# Patient Record
Sex: Male | Born: 1970 | Race: White | Hispanic: No | Marital: Married | Smoking: Never smoker
Health system: Southern US, Community
[De-identification: ages and names within clinical notes are randomized; demographics above are authoritative.]

## PROBLEM LIST (undated history)

## (undated) DIAGNOSIS — M1990 Unspecified osteoarthritis, unspecified site: Secondary | ICD-10-CM

## (undated) DIAGNOSIS — K219 Gastro-esophageal reflux disease without esophagitis: Secondary | ICD-10-CM

## (undated) DIAGNOSIS — E785 Hyperlipidemia, unspecified: Secondary | ICD-10-CM

## (undated) DIAGNOSIS — I10 Essential (primary) hypertension: Secondary | ICD-10-CM

## (undated) HISTORY — DX: Hyperlipidemia, unspecified: E78.5

## (undated) HISTORY — PX: KNEE ARTHROSCOPY: SUR90

## (undated) HISTORY — DX: Unspecified osteoarthritis, unspecified site: M19.90

## (undated) HISTORY — PX: ARTHROSCOPIC REPAIR ACL: SUR80

## (undated) HISTORY — DX: Essential (primary) hypertension: I10

## (undated) HISTORY — DX: Gastro-esophageal reflux disease without esophagitis: K21.9

---

## 1999-01-13 ENCOUNTER — Encounter: Payer: Self-pay | Admitting: Emergency Medicine

## 1999-01-13 ENCOUNTER — Emergency Department (HOSPITAL_COMMUNITY): Admission: EM | Admit: 1999-01-13 | Discharge: 1999-01-13 | Payer: Self-pay | Admitting: Emergency Medicine

## 2007-03-25 ENCOUNTER — Emergency Department (HOSPITAL_COMMUNITY): Admission: EM | Admit: 2007-03-25 | Discharge: 2007-03-25 | Payer: Self-pay | Admitting: Emergency Medicine

## 2011-04-27 LAB — CBC
HCT: 46.1
Hemoglobin: 15.8
MCHC: 34.2
MCV: 89.3
Platelets: 275
RBC: 5.17
RDW: 12.8
WBC: 7

## 2011-04-27 LAB — POCT I-STAT CREATININE
Creatinine, Ser: 1.3
Operator id: 235561

## 2011-04-27 LAB — DIFFERENTIAL
Basophils Absolute: 0
Basophils Relative: 0
Eosinophils Absolute: 0.2
Eosinophils Relative: 3
Lymphocytes Relative: 27
Lymphs Abs: 1.9
Monocytes Absolute: 0.6
Monocytes Relative: 9
Neutro Abs: 4.3
Neutrophils Relative %: 61

## 2011-04-27 LAB — I-STAT 8, (EC8 V) (CONVERTED LAB)
BUN: 23
Bicarbonate: 26 — ABNORMAL HIGH
Chloride: 100
Glucose, Bld: 90
HCT: 51
Hemoglobin: 17.3 — ABNORMAL HIGH
Operator id: 235561
Potassium: 3.5
Sodium: 136
TCO2: 27
pCO2, Ven: 44.5 — ABNORMAL LOW
pH, Ven: 7.375 — ABNORMAL HIGH

## 2011-11-09 ENCOUNTER — Other Ambulatory Visit (HOSPITAL_COMMUNITY): Payer: Self-pay | Admitting: Physician Assistant

## 2011-11-09 ENCOUNTER — Ambulatory Visit (HOSPITAL_COMMUNITY)
Admission: RE | Admit: 2011-11-09 | Discharge: 2011-11-09 | Disposition: A | Discharge status: Home / Self Care | Payer: PRIVATE HEALTH INSURANCE | Source: Ambulatory Visit | Attending: Physician Assistant | Admitting: Physician Assistant

## 2011-11-09 DIAGNOSIS — R52 Pain, unspecified: Secondary | ICD-10-CM

## 2011-11-09 DIAGNOSIS — R079 Chest pain, unspecified: Secondary | ICD-10-CM | POA: Insufficient documentation

## 2013-03-19 ENCOUNTER — Other Ambulatory Visit: Payer: Self-pay | Admitting: Family Medicine

## 2013-03-23 LAB — PATHOLOGY

## 2013-12-10 IMAGING — CR DG CHEST 2V
2 series · 2 of 2 positions shown · non-contrast
Comparison: None.

CLINICAL DATA: Right chest pain, worse with deep inspiration.

CHEST - 2 VIEW

[w chest pa]
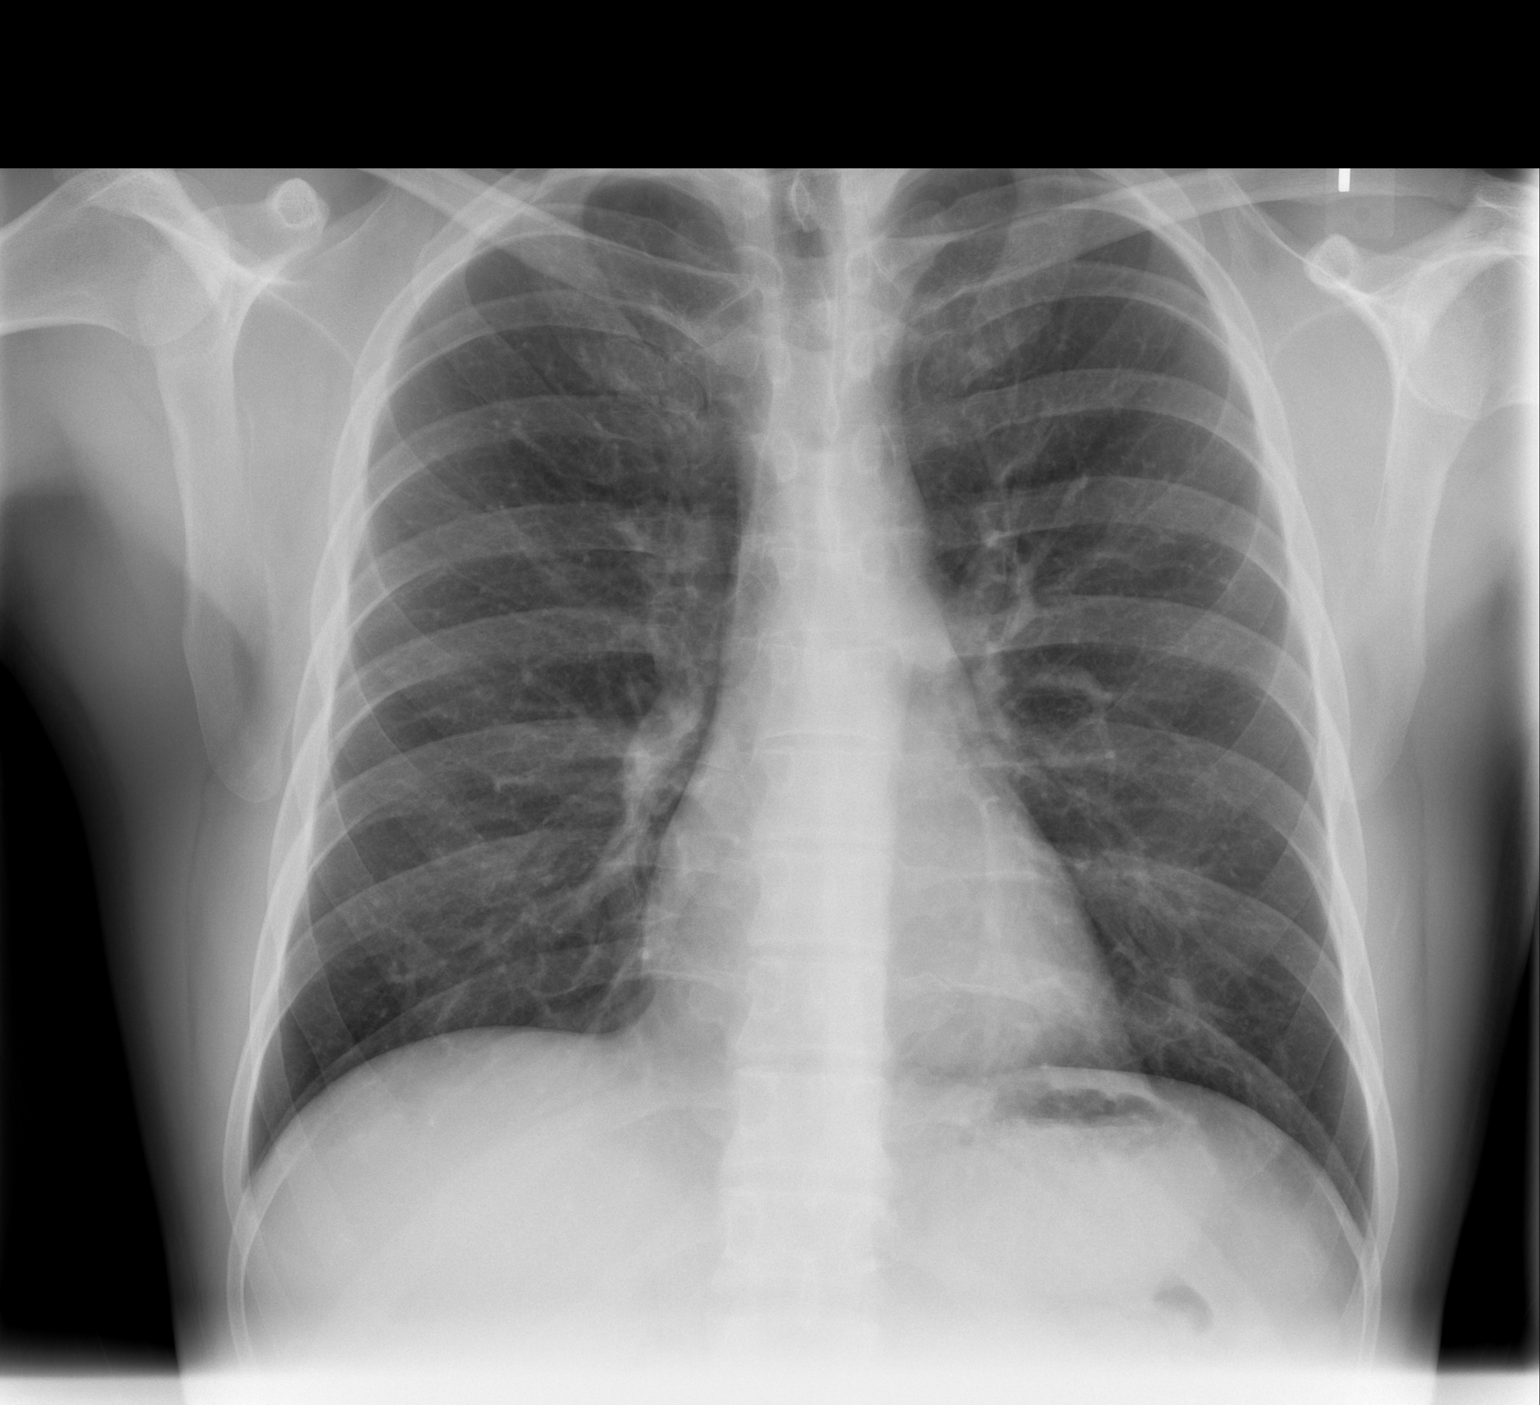

[w chest lat]
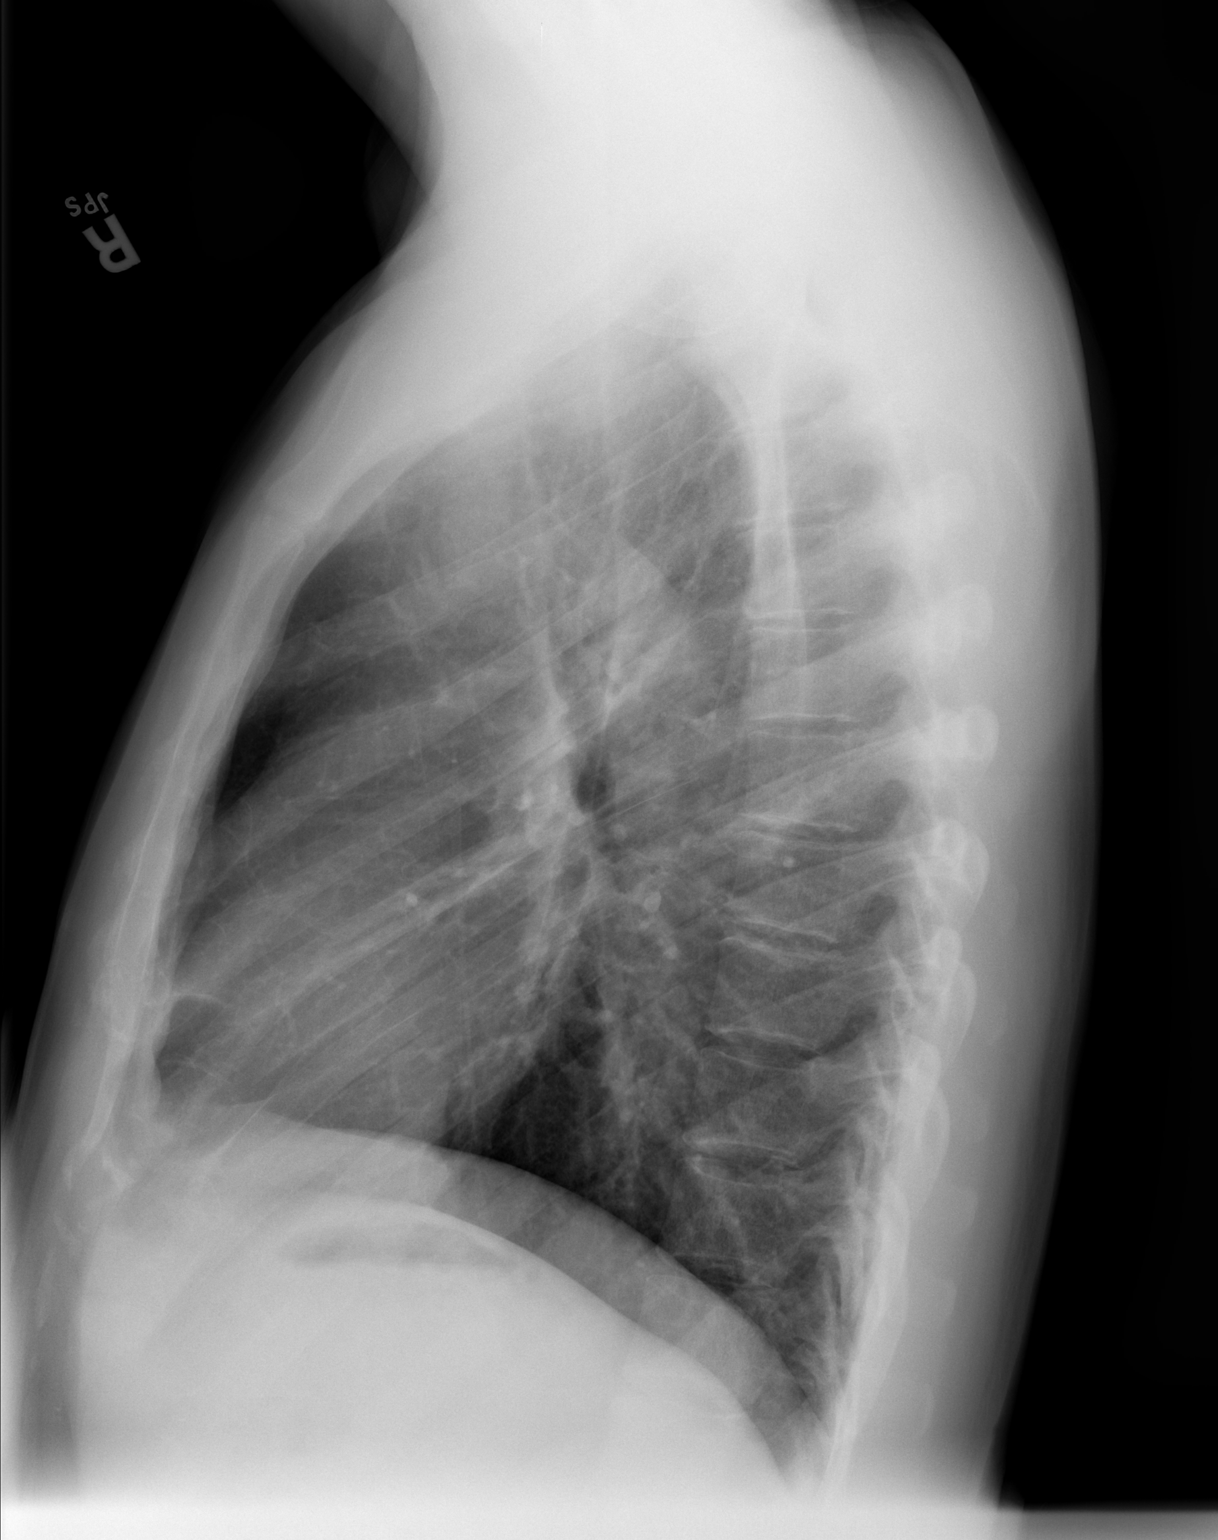

[2 of 2 positions shown; findings below may reference images not displayed]

FINDINGS: Heart and mediastinal contours are within normal limits.
The lung fields are clear with no signs of focal infiltrate or
congestive failure.  No pleural fluid or significant peribronchial
cuffing is noted.

Bony structures appear intact.
IMPRESSION: No worrisome focal or acute cardiopulmonary abnormality noted.

## 2017-07-16 HISTORY — PX: TOTAL KNEE ARTHROPLASTY: SHX125

## 2022-12-11 ENCOUNTER — Encounter: Payer: Self-pay | Admitting: Gastroenterology

## 2022-12-20 ENCOUNTER — Ambulatory Visit (AMBULATORY_SURGERY_CENTER): Payer: No Typology Code available for payment source

## 2022-12-20 VITALS — Ht 70.0 in | Wt 180.0 lb

## 2022-12-20 DIAGNOSIS — Z1211 Encounter for screening for malignant neoplasm of colon: Secondary | ICD-10-CM

## 2022-12-20 MED ORDER — NA SULFATE-K SULFATE-MG SULF 17.5-3.13-1.6 GM/177ML PO SOLN: 1.0000 | Freq: Once | ORAL | 0 refills | Status: AC

## 2022-12-20 NOTE — Progress Notes (Signed)
Pre visit completed via phone call; Patient verified name, DOB, and address;  No egg or soy allergy known to patient;  No issues known to pt with past sedation with any surgeries or procedures; Patient denies ever being told they had issues or difficulty with intubation;  No FH of Malignant Hyperthermia; Pt is not on diet pills; Pt is not on home 02;  Pt is not on blood thinners;  Pt denies issues with constipation;  No A fib or A flutter; Have any cardiac testing pending--NO Pt instructed to use Single[employer]com or GoodRx for a price reduction on prep;   Insurance verified during PV appt=[employer]  Patient's chart reviewed by Cathlyn Parsons CNRA prior to previsit and patient appropriate for the [facility].  Previsit completed and red dot placed by patient's name on their procedure day (on provider's schedule).    Instructions printed and mailed to the patient; GoodRX coupon info sent to in Rx;

## 2023-01-01 ENCOUNTER — Encounter: Payer: Self-pay | Admitting: Gastroenterology

## 2023-01-23 ENCOUNTER — Ambulatory Visit (AMBULATORY_SURGERY_CENTER): Payer: Self-pay | Admitting: Gastroenterology

## 2023-01-23 ENCOUNTER — Encounter: Payer: Self-pay | Admitting: Gastroenterology

## 2023-01-23 VITALS — BP 107/71 | HR 56 | Temp 97.3°F | Resp 17 | Ht 70.0 in | Wt 180.0 lb

## 2023-01-23 DIAGNOSIS — D127 Benign neoplasm of rectosigmoid junction: Secondary | ICD-10-CM

## 2023-01-23 DIAGNOSIS — Z1211 Encounter for screening for malignant neoplasm of colon: Secondary | ICD-10-CM

## 2023-01-23 DIAGNOSIS — D123 Benign neoplasm of transverse colon: Secondary | ICD-10-CM

## 2023-01-23 DIAGNOSIS — D128 Benign neoplasm of rectum: Secondary | ICD-10-CM

## 2023-01-23 NOTE — Progress Notes (Signed)
Pt's states no medical or surgical changes since previsit or office visit. 

## 2023-01-23 NOTE — Patient Instructions (Signed)
YOU HAD AN ENDOSCOPIC PROCEDURE TODAY AT THE [facility]:   Refer to the procedure report that was given to you for any specific questions about what was found during the examination.  If the procedure report does not answer your questions, please call your gastroenterologist to clarify.  If you requested that your care partner not be given the details of your procedure findings, then the procedure report has been included in a sealed envelope for you to review at your convenience later.  YOU SHOULD EXPECT: Some feelings of bloating in the abdomen. Passage of more gas than usual.  Walking can help get rid of the air that was put into your GI tract during the procedure and reduce the bloating. If you had a lower endoscopy (such as a colonoscopy or flexible sigmoidoscopy) you may notice spotting of blood in your stool or on the toilet paper. If you underwent a bowel prep for your procedure, you may not have a normal bowel movement for a few days.  Please Note:  You might notice some irritation and congestion in your nose or some drainage.  This is from the oxygen used during your procedure.  There is no need for concern and it should clear up in a day or so.  SYMPTOMS TO REPORT IMMEDIATELY:  Following lower endoscopy (colonoscopy or flexible sigmoidoscopy):  Excessive amounts of blood in the stool  Significant tenderness or worsening of abdominal pains  Swelling of the abdomen that is new, acute  Fever of 100F or higher  For urgent or emergent issues, a gastroenterologist can be reached at any hour by calling [phone]. Do not use MyChart messaging for urgent concerns.    DIET:  We do recommend a small meal at first, but then you may proceed to your regular diet.  Drink plenty of fluids but you should avoid alcoholic beverages for 24 hours.  ACTIVITY:  You should plan to take it easy for the rest of today and you should NOT DRIVE or use heavy machinery until tomorrow (because of  the sedation medicines used during the test).    FOLLOW UP: Our staff will call the number listed on your records the next business day following your procedure.  We will call around 7:15- 8:00 am to check on you and address any questions or concerns that you may have regarding the information given to you following your procedure. If we do not reach you, we will leave a message.     If any biopsies were taken you will be contacted by phone or by letter within the next 1-3 weeks.  Please call us at [phone] if you have not heard about the biopsies in 3 weeks.    SIGNATURES/CONFIDENTIALITY: You and/or your care partner have signed paperwork which will be entered into your electronic medical record.  These signatures attest to the fact that that the information above on your After Visit Summary has been reviewed and is understood.  Full responsibility of the confidentiality of this discharge information lies with you and/or your care-partner.  

## 2023-01-23 NOTE — Progress Notes (Signed)
Called to room to assist during endoscopic procedure.  Patient ID and intended procedure confirmed with present staff. Received instructions for my participation in the procedure from the performing physician.  

## 2023-01-23 NOTE — Progress Notes (Signed)
A/ox3, pleased with MAC, report to RN 

## 2023-01-23 NOTE — Op Note (Signed)
[facility] Patient Name: Parker Gould Procedure Date: 01/23/2023 9:16 AM MRN: [id number] Endoscopist: Corliss Parish , MD, [id number] Age: 52 Referring MD:  Date of Birth: [birth year] Gender: Male Account #: [id number] Procedure:                Colonoscopy Indications:              Screening for colorectal malignant neoplasm Medicines:                Monitored Anesthesia Care Procedure:                Pre-Anesthesia Assessment:                           - Prior to the procedure, a History and Physical                            was performed, and patient medications and                            allergies were reviewed. The patient's tolerance of                            previous anesthesia was also reviewed. The risks                            and benefits of the procedure and the sedation                            options and risks were discussed with the patient.                            All questions were answered, and informed consent                            was obtained. Prior Anticoagulants: The patient has                            taken no anticoagulant or antiplatelet agents. ASA                            Grade Assessment: II - A patient with mild systemic                            disease. After reviewing the risks and benefits,                            the patient was deemed in satisfactory condition to                            undergo the procedure.                           After obtaining informed consent, the colonoscope  was passed under direct vision. Throughout the                            procedure, the patient's blood pressure, pulse, and                            oxygen saturations were monitored continuously. The                            CF [id number] #[id number] was introduced through the anus                            and advanced to the 3 cm into the ileum. The                            colonoscopy was  performed without difficulty. The                            patient tolerated the procedure. The quality of the                            bowel preparation was good. The terminal ileum,                            ileocecal valve, appendiceal orifice, and rectum                            were photographed. Scope In: 9:20:10 AM Scope Out: 9:38:15 AM Scope Withdrawal Time: 0 hours 15 minutes 30 seconds  Total Procedure Duration: 0 hours 18 minutes 5 seconds  Findings:                 The digital rectal exam findings include                            hemorrhoids. Pertinent negatives include no                            palpable rectal lesions.                           The terminal ileum and ileocecal valve appeared                            normal.                           Five sessile polyps were found in the rectum,                            recto-sigmoid colon and transverse colon. The                            polyps were 2 to 7 mm in size. These polyps were  removed with a cold snare. Resection and retrieval                            were complete.                           Normal mucosa was found in the entire colon                            otherwise.                           Non-bleeding non-thrombosed internal hemorrhoids                            were found during retroflexion, during perianal                            exam and during digital exam. The hemorrhoids were                            Grade II (internal hemorrhoids that prolapse but                            reduce spontaneously). Complications:            No immediate complications. Estimated Blood Loss:     Estimated blood loss was minimal. Impression:               - Hemorrhoids found on digital rectal exam.                           - The examined portion of the ileum was normal.                           - Five 2 to 7 mm polyps in the rectum, at the                             recto-sigmoid colon and in the transverse colon,                            removed with a cold snare. Resected and retrieved.                           - Normal mucosa in the entire examined colon                            otherwise.                           - Non-bleeding non-thrombosed internal hemorrhoids. Recommendation:           - The patient will be observed post-procedure,                            until all discharge criteria are met.                           -  Discharge patient to home.                           - Patient has a contact number available for                            emergencies. The signs and symptoms of potential                            delayed complications were discussed with the                            patient. Return to normal activities tomorrow.                            Written discharge instructions were provided to the                            patient.                           - High fiber diet.                           - Use FiberCon 1-2 tablets PO daily.                           - Continue present medications.                           - Await pathology results.                           - Repeat colonoscopy in 3-7 years for surveillance                            based on pathology results.                           - The findings and recommendations were discussed                            with the patient.                           - The findings and recommendations were discussed                            with the patient's family. Corliss Parish, MD 01/23/2023 9:44:18 AM

## 2023-01-23 NOTE — Progress Notes (Signed)
GASTROENTEROLOGY PROCEDURE H&P NOTE   Primary Care Physician: Pcp, No  HPI: Parker Gould is a 52 y.o. male who presents for Colonoscopy for screening.  Past Medical History:  Diagnosis Date   Arthritis    LEFT knee   GERD (gastroesophageal reflux disease)    PRN OTC meds   Hyperlipidemia    on meds   Hypertension    on meds   Past Surgical History:  Procedure Laterality Date   ARTHROSCOPIC REPAIR ACL Left    x 2   KNEE ARTHROSCOPY Left    x 2   TOTAL KNEE ARTHROPLASTY Left 2019   Current Outpatient Medications  Medication Sig Dispense Refill   atorvastatin (LIPITOR) 20 MG tablet Take 20 mg by mouth daily.     benazepril (LOTENSIN) 10 MG tablet Take 10 mg by mouth daily.     esomeprazole (NEXIUM) 20 MG capsule Take 20 mg by mouth daily as needed (acid reflux).     No current facility-administered medications for this visit.    Current Outpatient Medications:    atorvastatin (LIPITOR) 20 MG tablet, Take 20 mg by mouth daily., Disp: , Rfl:    benazepril (LOTENSIN) 10 MG tablet, Take 10 mg by mouth daily., Disp: , Rfl:    esomeprazole (NEXIUM) 20 MG capsule, Take 20 mg by mouth daily as needed (acid reflux)., Disp: , Rfl:  No Known Allergies Family History  Problem Relation Age of Onset   Colon cancer Neg Hx    Colon polyps Neg Hx    Esophageal cancer Neg Hx    Stomach cancer Neg Hx    Rectal cancer Neg Hx    Social History   Socioeconomic History   Marital status: Married    Spouse name: Not on file   Number of children: Not on file   Years of education: Not on file   Highest education level: Not on file  Occupational History   Not on file  Tobacco Use   Smoking status: Never   Smokeless tobacco: Never  Vaping Use   Vaping Use: Never used  Substance and Sexual Activity   Alcohol use: Not Currently   Drug use: Never   Sexual activity: Not on file  Other Topics Concern   Not on file  Social History Narrative   Not on file   Social Determinants  of Health   Financial Resource Strain: Not on file  Food Insecurity: Not on file  Transportation Needs: Not on file  Physical Activity: Not on file  Stress: Not on file  Social Connections: Not on file  Intimate Partner Violence: Not on file    Physical Exam: Today's Vitals   01/23/23 0808 01/23/23 0809  BP: 139/83   Pulse: 69   Temp: (!) 97.3 F (36.3 C) (!) 97.3 F (36.3 C)  TempSrc: Temporal   SpO2: 99%   Weight: 180 lb (81.6 kg)   Height: 5\' 10"  (1.778 m)    Body mass index is 25.83 kg/m. GEN: NAD EYE: Sclerae anicteric ENT: MMM CV: Non-tachycardic GI: Soft, NT/ND NEURO:  Alert & Oriented x 3  Lab Results: No results for input(s): "WBC", "HGB", "HCT", "PLT" in the last 72 hours. BMET No results for input(s): "NA", "K", "CL", "CO2", "GLUCOSE", "BUN", "CREATININE", "CALCIUM" in the last 72 hours. LFT No results for input(s): "PROT", "ALBUMIN", "AST", "ALT", "ALKPHOS", "BILITOT", "BILIDIR", "IBILI" in the last 72 hours. PT/INR No results for input(s): "LABPROT", "INR" in the last 72 hours.   Impression /  Plan: This is a 52 y.o.male who presents for Colonoscopy for screening.  The risks and benefits of endoscopic evaluation/treatment were discussed with the patient and/or family; these include but are not limited to the risk of perforation, infection, bleeding, missed lesions, lack of diagnosis, severe illness requiring hospitalization, as well as anesthesia and sedation related illnesses.  The patient's history has been reviewed, patient examined, no change in status, and deemed stable for procedure.  The patient and/or family is agreeable to proceed.    Corliss Parish, MD [facility]oscopy Office # [phone]

## 2023-01-24 ENCOUNTER — Telehealth: Payer: Self-pay

## 2023-01-24 NOTE — Telephone Encounter (Signed)
  Follow up Call-     01/23/2023    8:09 AM  Call back number  Post procedure Call Back phone  # [phone]  Permission to leave phone message Yes     Patient questions:  Do you have a fever, pain , or abdominal swelling? No. Pain Score  0 *  Have you tolerated food without any problems? Yes.    Have you been able to return to your normal activities? Yes.    Do you have any questions about your discharge instructions: Diet   No. Medications  No. Follow up visit  No.  Do you have questions or concerns about your Care? No.  Actions: * If pain score is 4 or above: No action needed, pain <4.

## 2023-01-30 ENCOUNTER — Encounter: Payer: Self-pay | Admitting: Gastroenterology

## 2024-04-06 NOTE — Progress Notes (Incomplete)
  Cardiology Office Note:   Date:  04/06/2024  ID:  Parker Gould, DOB November 10, 1970, MRN [id number] PCP: Freddrick No  [facility] Providers Cardiologist:  None { Chief Complaint: No chief complaint on file.     History of Present Illness:   Parker Gould is a 53 y.o. male with a PMH of HTN and HLD who presents as a self-referral for the evaluation of bradycardia.   Past Medical History:  Diagnosis Date   Arthritis    LEFT knee   GERD (gastroesophageal reflux disease)    PRN OTC meds   Hyperlipidemia    on meds   Hypertension    on meds     Studies Reviewed:    EKG: ***           Risk Assessment/Calculations:   {Does this patient have ATRIAL FIBRILLATION?:[phone]} No BP recorded.  {Refresh Note OR Click here to enter BP  :1}***        Physical Exam:     VS:  There were no vitals taken for this visit. ***    Wt Readings from Last 3 Encounters:  01/23/23 180 lb (81.6 kg)  12/20/22 180 lb (81.6 kg)     GEN: Well nourished, well developed, in no acute distress NECK: No JVD; No carotid bruits CARDIAC: ***RRR, no murmurs, rubs, gallops RESPIRATORY:  Clear to auscultation without rales, wheezing or rhonchi  ABDOMEN: Soft, non-tender, non-distended, normal bowel sounds EXTREMITIES:  Warm and well perfused, no edema; No deformity, 2+ radial pulses PSYCH: Normal mood and affect   Assessment & Plan       {Are you ordering a CV Procedure (e.g. stress test, cath, DCCV, TEE, etc)?   Press F2        :[id number]}   This note was written with the assistance of a dictation microphone or AI dictation software. Please excuse any typos or grammatical errors.   Signed, Georganna Archer, MD 04/06/2024 1:57 PM    [facility]

## 2024-04-07 ENCOUNTER — Encounter: Payer: Self-pay | Admitting: Student in an Organized Health Care Education/Training Program

## 2024-04-07 ENCOUNTER — Ambulatory Visit
Attending: Student in an Organized Health Care Education/Training Program | Admitting: Student in an Organized Health Care Education/Training Program

## 2024-04-07 VITALS — BP 122/84 | HR 74 | Ht 70.0 in | Wt 178.0 lb

## 2024-04-07 DIAGNOSIS — R001 Bradycardia, unspecified: Secondary | ICD-10-CM | POA: Diagnosis not present

## 2024-04-07 DIAGNOSIS — R55 Syncope and collapse: Secondary | ICD-10-CM

## 2024-04-07 DIAGNOSIS — I10 Essential (primary) hypertension: Secondary | ICD-10-CM | POA: Diagnosis not present

## 2024-04-07 NOTE — Patient Instructions (Signed)
 Testing/Procedures: Echocardiogram  Your physician has requested that you have an echocardiogram. Echocardiography is a painless test that uses sound waves to create images of your heart. It provides your doctor with information about the size and shape of your heart and how well your heart's chambers and valves are working. This procedure takes approximately one hour. There are no restrictions for this procedure. Please do NOT wear cologne, perfume, aftershave, or lotions (deodorant is allowed). Please arrive 15 minutes prior to your appointment time.  Please note: We ask at that you not bring children with you during ultrasound (echo/ vascular) testing. Due to room size and safety concerns, children are not allowed in the ultrasound rooms during exams. Our front office staff cannot provide observation of children in our lobby area while testing is being conducted. An adult accompanying a patient to their appointment will only be allowed in the ultrasound room at the discretion of the ultrasound technician under special circumstances. We apologize for any inconvenience.  Exercise tolerance test   Exercise Tolerance Test  Please arrive 15 minutes prior to your appointment time for registration and insurance purposes.  The test will take approximately 45 minutes to complete.  How to prepare for your Exercise Stress Test: Do bring a list of your current medications with you.  If not listed below, you may take your medications as normal. Do wear comfortable clothes (no dresses or overalls) and walking shoes, tennis shoes preferred (no heels or open toed shoes are allowed) Do Not wear cologne, perfume, aftershave or lotions (deodorant is allowed).   If these instructions are not followed, your test will have to be rescheduled.  If you have questions or concerns about your appointment, you can call the Stress Lab at [phone].  If you cannot keep your appointment, please provide 24 hours  notification to the Stress Lab, to avoid a possible $50 charge to your account.   Follow-Up: At [facility], you and your health needs are our priority.  As part of our continuing mission to provide you with exceptional heart care, our providers are all part of one team.  This team includes your primary Cardiologist (physician) and Advanced Practice Providers or APPs (Physician Assistants and Nurse Practitioners) who all work together to provide you with the care you need, when you need it.  Your next appointment:   As needed  Provider:   Georganna Archer, MD

## 2024-04-10 ENCOUNTER — Observation Stay (HOSPITAL_COMMUNITY): Admitting: Anesthesiology

## 2024-04-10 ENCOUNTER — Other Ambulatory Visit: Payer: Self-pay

## 2024-04-10 ENCOUNTER — Emergency Department (HOSPITAL_COMMUNITY)

## 2024-04-10 ENCOUNTER — Encounter (HOSPITAL_COMMUNITY): Payer: Self-pay | Admitting: Orthopedic Surgery

## 2024-04-10 ENCOUNTER — Encounter (HOSPITAL_COMMUNITY)
Admission: EM | Disposition: A | Discharge status: Home / Self Care | Payer: Self-pay | Source: Home / Self Care | Attending: Orthopedic Surgery

## 2024-04-10 ENCOUNTER — Inpatient Hospital Stay (HOSPITAL_COMMUNITY)
Admission: EM | Admit: 2024-04-10 | Discharge: 2024-04-13 | DRG: 487 | Disposition: A | Discharge status: Home / Self Care | Attending: Orthopedic Surgery | Admitting: Orthopedic Surgery

## 2024-04-10 ENCOUNTER — Inpatient Hospital Stay: Admit: 2024-04-10 | Admitting: Orthopedic Surgery

## 2024-04-10 DIAGNOSIS — M25462 Effusion, left knee: Secondary | ICD-10-CM | POA: Diagnosis not present

## 2024-04-10 DIAGNOSIS — I10 Essential (primary) hypertension: Secondary | ICD-10-CM | POA: Diagnosis present

## 2024-04-10 DIAGNOSIS — E7800 Pure hypercholesterolemia, unspecified: Secondary | ICD-10-CM | POA: Diagnosis present

## 2024-04-10 DIAGNOSIS — E785 Hyperlipidemia, unspecified: Secondary | ICD-10-CM | POA: Diagnosis present

## 2024-04-10 DIAGNOSIS — T8454XA Infection and inflammatory reaction due to internal left knee prosthesis, initial encounter: Principal | ICD-10-CM | POA: Diagnosis present

## 2024-04-10 DIAGNOSIS — R55 Syncope and collapse: Principal | ICD-10-CM

## 2024-04-10 DIAGNOSIS — Z79899 Other long term (current) drug therapy: Secondary | ICD-10-CM

## 2024-04-10 DIAGNOSIS — M1712 Unilateral primary osteoarthritis, left knee: Secondary | ICD-10-CM | POA: Diagnosis present

## 2024-04-10 DIAGNOSIS — Y831 Surgical operation with implant of artificial internal device as the cause of abnormal reaction of the patient, or of later complication, without mention of misadventure at the time of the procedure: Secondary | ICD-10-CM | POA: Diagnosis present

## 2024-04-10 DIAGNOSIS — T8454XD Infection and inflammatory reaction due to internal left knee prosthesis, subsequent encounter: Secondary | ICD-10-CM

## 2024-04-10 DIAGNOSIS — K219 Gastro-esophageal reflux disease without esophagitis: Secondary | ICD-10-CM | POA: Diagnosis present

## 2024-04-10 HISTORY — PX: IRRIGATION AND DEBRIDEMENT KNEE: SHX5185

## 2024-04-10 LAB — SYNOVIAL CELL COUNT + DIFF, W/ CRYSTALS
Crystals, Fluid: NONE SEEN
Eosinophils-Synovial: 0 % (ref 0–1)
Lymphocytes-Synovial Fld: 0 % (ref 0–20)
Monocyte-Macrophage-Synovial Fluid: 7 % — ABNORMAL LOW (ref 50–90)
Neutrophil, Synovial: 93 % — ABNORMAL HIGH (ref 0–25)
WBC, Synovial: 34600 /mm3 — ABNORMAL HIGH (ref 0–200)

## 2024-04-10 LAB — CBC WITH DIFFERENTIAL/PLATELET
Abs Immature Granulocytes: 0.04 K/uL (ref 0.00–0.07)
Basophils Absolute: 0 K/uL (ref 0.0–0.1)
Basophils Relative: 0 %
Eosinophils Absolute: 0.1 K/uL (ref 0.0–0.5)
Eosinophils Relative: 0 %
HCT: 46.1 % (ref 39.0–52.0)
Hemoglobin: 15 g/dL (ref 13.0–17.0)
Immature Granulocytes: 0 %
Lymphocytes Relative: 17 %
Lymphs Abs: 2.1 K/uL (ref 0.7–4.0)
MCH: 29.6 pg (ref 26.0–34.0)
MCHC: 32.5 g/dL (ref 30.0–36.0)
MCV: 91.1 fL (ref 80.0–100.0)
Monocytes Absolute: 1.1 K/uL — ABNORMAL HIGH (ref 0.1–1.0)
Monocytes Relative: 9 %
Neutro Abs: 9.2 K/uL — ABNORMAL HIGH (ref 1.7–7.7)
Neutrophils Relative %: 74 %
Platelets: 284 K/uL (ref 150–400)
RBC: 5.06 MIL/uL (ref 4.22–5.81)
RDW: 12.6 % (ref 11.5–15.5)
WBC: 12.6 K/uL — ABNORMAL HIGH (ref 4.0–10.5)
nRBC: 0 % (ref 0.0–0.2)

## 2024-04-10 LAB — COMPREHENSIVE METABOLIC PANEL WITH GFR
ALT: 32 U/L (ref 0–44)
AST: 22 U/L (ref 15–41)
Albumin: 4.6 g/dL (ref 3.5–5.0)
Alkaline Phosphatase: 80 U/L (ref 38–126)
Anion gap: 12 (ref 5–15)
BUN: 11 mg/dL (ref 6–20)
CO2: 24 mmol/L (ref 22–32)
Calcium: 9.8 mg/dL (ref 8.9–10.3)
Chloride: 102 mmol/L (ref 98–111)
Creatinine, Ser: 1.18 mg/dL (ref 0.61–1.24)
GFR, Estimated: >60 mL/min (ref >60)
Glucose, Bld: 94 mg/dL (ref 70–99)
Potassium: 4 mmol/L (ref 3.5–5.1)
Sodium: 137 mmol/L (ref 135–145)
Total Bilirubin: 1 mg/dL (ref 0.0–1.2)
Total Protein: 7.2 g/dL (ref 6.5–8.1)

## 2024-04-10 LAB — TYPE AND SCREEN
ABO/RH(D): O NEG
Antibody Screen: NEGATIVE

## 2024-04-10 LAB — SEDIMENTATION RATE: Sed Rate: 5 mm/h (ref 0–16)

## 2024-04-10 LAB — ABO/RH: ABO/RH(D): O NEG

## 2024-04-10 LAB — TROPONIN T, HIGH SENSITIVITY
Troponin T High Sensitivity: <15 ng/L (ref 0–19)
Troponin T High Sensitivity: <15 ng/L (ref 0–19)

## 2024-04-10 LAB — CBG MONITORING, ED: Glucose-Capillary: 104 mg/dL — ABNORMAL HIGH (ref 70–99)

## 2024-04-10 LAB — C-REACTIVE PROTEIN: CRP: 5.8 mg/dL — ABNORMAL HIGH (ref <1.0)

## 2024-04-10 SURGERY — IRRIGATION AND DEBRIDEMENT KNEE
Anesthesia: General | Site: Knee | Laterality: Left

## 2024-04-10 MED ORDER — SODIUM CHLORIDE 0.9 % IV SOLN: 2.0000 g | INTRAVENOUS | Status: DC

## 2024-04-10 MED ORDER — PROPOFOL 10 MG/ML IV BOLUS
INTRAVENOUS | Status: DC | PRN
  Administered 2024-04-10: 160 mg via INTRAVENOUS

## 2024-04-10 MED ORDER — HYDROCODONE-ACETAMINOPHEN 5-325 MG PO TABS
1.0000 | ORAL_TABLET | ORAL | Status: DC | PRN
  Administered 2024-04-11 – 2024-04-13 (×9): 1 via ORAL
  Filled 2024-04-10 (×9): qty 1

## 2024-04-10 MED ORDER — DEXAMETHASONE SODIUM PHOSPHATE 10 MG/ML IJ SOLN
INTRAMUSCULAR | Status: DC | PRN
Start: 2024-04-10 — End: 2024-04-10
  Administered 2024-04-10: 5 mg via INTRAVENOUS

## 2024-04-10 MED ORDER — TOBRAMYCIN SULFATE 1.2 G IJ SOLR
INTRAMUSCULAR | Status: AC
  Filled 2024-04-10: qty 1.2

## 2024-04-10 MED ORDER — PANTOPRAZOLE SODIUM 40 MG PO TBEC
40.0000 mg | DELAYED_RELEASE_TABLET | Freq: Every day | ORAL | Status: DC
Start: 2024-04-11 — End: 2024-04-13
  Administered 2024-04-11 – 2024-04-13 (×3): 40 mg via ORAL
  Filled 2024-04-10 (×3): qty 1

## 2024-04-10 MED ORDER — ONDANSETRON HCL 4 MG/2ML IJ SOLN: 4.0000 mg | Freq: Once | INTRAMUSCULAR | Status: DC | PRN

## 2024-04-10 MED ORDER — KETOROLAC TROMETHAMINE 30 MG/ML IJ SOLN
INTRAMUSCULAR | Status: AC
  Filled 2024-04-10: qty 1

## 2024-04-10 MED ORDER — HYDROCODONE-ACETAMINOPHEN 7.5-325 MG PO TABS
1.0000 | ORAL_TABLET | ORAL | Status: DC | PRN
  Administered 2024-04-10 – 2024-04-12 (×3): 1 via ORAL
  Filled 2024-04-10 (×3): qty 1

## 2024-04-10 MED ORDER — TRANEXAMIC ACID-NACL 1000-0.7 MG/100ML-% IV SOLN
1000.0000 mg | INTRAVENOUS | Status: AC
  Administered 2024-04-10: 1000 mg via INTRAVENOUS
  Filled 2024-04-10: qty 100

## 2024-04-10 MED ORDER — ACETAMINOPHEN 500 MG PO TABS
1000.0000 mg | ORAL_TABLET | Freq: Once | ORAL | Status: AC
  Administered 2024-04-10: 1000 mg via ORAL
  Filled 2024-04-10: qty 2

## 2024-04-10 MED ORDER — VANCOMYCIN HCL IN DEXTROSE 1-5 GM/200ML-% IV SOLN
1000.0000 mg | Freq: Two times a day (BID) | INTRAVENOUS | Status: DC
  Filled 2024-04-10: qty 200

## 2024-04-10 MED ORDER — OXYCODONE HCL 5 MG PO TABS: 5.0000 mg | ORAL_TABLET | Freq: Once | ORAL | Status: DC | PRN

## 2024-04-10 MED ORDER — ATORVASTATIN CALCIUM 20 MG PO TABS
20.0000 mg | ORAL_TABLET | Freq: Every day | ORAL | Status: DC
Start: 2024-04-11 — End: 2024-04-13
  Administered 2024-04-12 – 2024-04-13 (×2): 20 mg via ORAL
  Filled 2024-04-10 (×2): qty 1

## 2024-04-10 MED ORDER — LIDOCAINE HCL (CARDIAC) PF 100 MG/5ML IV SOSY
PREFILLED_SYRINGE | INTRAVENOUS | Status: DC | PRN
  Administered 2024-04-10: 80 mg via INTRAVENOUS

## 2024-04-10 MED ORDER — VANCOMYCIN HCL 1000 MG IV SOLR
INTRAVENOUS | Status: AC
  Filled 2024-04-10: qty 20

## 2024-04-10 MED ORDER — POVIDONE-IODINE 10 % EX SWAB: 2.0000 | Freq: Once | CUTANEOUS | Status: DC

## 2024-04-10 MED ORDER — 0.9 % SODIUM CHLORIDE (POUR BTL) OPTIME
TOPICAL | Status: DC | PRN
  Administered 2024-04-10: 1000 mL

## 2024-04-10 MED ORDER — VANCOMYCIN HCL 1750 MG/350ML IV SOLN
1750.0000 mg | Freq: Once | INTRAVENOUS | Status: AC
  Administered 2024-04-10: 1750 mg via INTRAVENOUS
  Filled 2024-04-10: qty 350

## 2024-04-10 MED ORDER — ONDANSETRON HCL 4 MG PO TABS: 4.0000 mg | ORAL_TABLET | Freq: Four times a day (QID) | ORAL | Status: DC | PRN

## 2024-04-10 MED ORDER — ACETAMINOPHEN 325 MG PO TABS: 325.0000 mg | ORAL_TABLET | Freq: Four times a day (QID) | ORAL | Status: DC | PRN

## 2024-04-10 MED ORDER — ASPIRIN 81 MG PO CHEW
81.0000 mg | CHEWABLE_TABLET | Freq: Two times a day (BID) | ORAL | Status: DC
  Administered 2024-04-10 – 2024-04-13 (×6): 81 mg via ORAL
  Filled 2024-04-10 (×6): qty 1

## 2024-04-10 MED ORDER — ONDANSETRON HCL 4 MG/2ML IJ SOLN
INTRAMUSCULAR | Status: AC
  Filled 2024-04-10: qty 2

## 2024-04-10 MED ORDER — OXYCODONE HCL 5 MG/5ML PO SOLN: 5.0000 mg | Freq: Once | ORAL | Status: DC | PRN

## 2024-04-10 MED ORDER — SODIUM CHLORIDE 0.9 % IV SOLN: INTRAVENOUS | Status: DC

## 2024-04-10 MED ORDER — MEPERIDINE HCL 100 MG/ML IJ SOLN: 6.2500 mg | INTRAMUSCULAR | Status: DC | PRN

## 2024-04-10 MED ORDER — VANCOMYCIN HCL IN DEXTROSE 1-5 GM/200ML-% IV SOLN
1000.0000 mg | Freq: Two times a day (BID) | INTRAVENOUS | Status: DC
  Administered 2024-04-11: 1000 mg via INTRAVENOUS
  Filled 2024-04-10: qty 200

## 2024-04-10 MED ORDER — HYDROMORPHONE HCL 1 MG/ML IJ SOLN
0.2500 mg | INTRAMUSCULAR | Status: DC | PRN
  Administered 2024-04-10 (×4): 0.5 mg via INTRAVENOUS

## 2024-04-10 MED ORDER — FENTANYL CITRATE (PF) 100 MCG/2ML IJ SOLN
INTRAMUSCULAR | Status: AC
  Filled 2024-04-10: qty 2

## 2024-04-10 MED ORDER — DIPHENHYDRAMINE HCL 12.5 MG/5ML PO ELIX: 12.5000 mg | ORAL_SOLUTION | ORAL | Status: DC | PRN

## 2024-04-10 MED ORDER — LACTATED RINGERS IV BOLUS
1000.0000 mL | Freq: Once | INTRAVENOUS | Status: AC
Start: 2024-04-10 — End: 2024-04-10
  Administered 2024-04-10: 1000 mL via INTRAVENOUS

## 2024-04-10 MED ORDER — CEFAZOLIN SODIUM-DEXTROSE 2-4 GM/100ML-% IV SOLN
2.0000 g | INTRAVENOUS | Status: AC
  Administered 2024-04-10: 2 g via INTRAVENOUS
  Filled 2024-04-10: qty 100

## 2024-04-10 MED ORDER — AMISULPRIDE (ANTIEMETIC) 5 MG/2ML IV SOLN: 10.0000 mg | Freq: Once | INTRAVENOUS | Status: DC | PRN

## 2024-04-10 MED ORDER — PROPOFOL 10 MG/ML IV BOLUS
INTRAVENOUS | Status: AC
  Filled 2024-04-10: qty 20

## 2024-04-10 MED ORDER — SODIUM CHLORIDE (PF) 0.9 % IJ SOLN
INTRAMUSCULAR | Status: AC
  Filled 2024-04-10: qty 50

## 2024-04-10 MED ORDER — ALUM & MAG HYDROXIDE-SIMETH 200-200-20 MG/5ML PO SUSP: 30.0000 mL | ORAL | Status: DC | PRN

## 2024-04-10 MED ORDER — MENTHOL 3 MG MT LOZG: 1.0000 | LOZENGE | OROMUCOSAL | Status: DC | PRN

## 2024-04-10 MED ORDER — TOBRAMYCIN SULFATE 1.2 G IJ SOLR
INTRAMUSCULAR | Status: DC | PRN
  Administered 2024-04-10: 1.2 g

## 2024-04-10 MED ORDER — HYDROMORPHONE HCL 1 MG/ML IJ SOLN: 0.5000 mg | INTRAMUSCULAR | Status: DC | PRN

## 2024-04-10 MED ORDER — BENAZEPRIL HCL 20 MG PO TABS
10.0000 mg | ORAL_TABLET | Freq: Every day | ORAL | Status: DC
  Administered 2024-04-11 – 2024-04-13 (×3): 10 mg via ORAL
  Filled 2024-04-10 (×3): qty 1

## 2024-04-10 MED ORDER — HYDROMORPHONE HCL 1 MG/ML IJ SOLN
INTRAMUSCULAR | Status: AC
  Filled 2024-04-10: qty 2

## 2024-04-10 MED ORDER — ONDANSETRON HCL 4 MG/2ML IJ SOLN
INTRAMUSCULAR | Status: DC | PRN
Start: 2024-04-10 — End: 2024-04-10
  Administered 2024-04-10: 4 mg via INTRAVENOUS

## 2024-04-10 MED ORDER — PHENOL 1.4 % MT LIQD: 1.0000 | OROMUCOSAL | Status: DC | PRN

## 2024-04-10 MED ORDER — POLYETHYLENE GLYCOL 3350 17 G PO PACK
17.0000 g | PACK | Freq: Two times a day (BID) | ORAL | Status: DC
  Administered 2024-04-12: 17 g via ORAL
  Filled 2024-04-10 (×4): qty 1

## 2024-04-10 MED ORDER — METHOCARBAMOL 500 MG PO TABS
500.0000 mg | ORAL_TABLET | Freq: Four times a day (QID) | ORAL | Status: DC | PRN
  Administered 2024-04-10 – 2024-04-13 (×8): 500 mg via ORAL
  Filled 2024-04-10 (×8): qty 1

## 2024-04-10 MED ORDER — LIDOCAINE-EPINEPHRINE 2 %-1:100000 IJ SOLN
20.0000 mL | Freq: Once | INTRAMUSCULAR | Status: AC
  Administered 2024-04-10: 20 mL
  Filled 2024-04-10: qty 1

## 2024-04-10 MED ORDER — LACTATED RINGERS IV SOLN: INTRAVENOUS | Status: DC

## 2024-04-10 MED ORDER — SODIUM CHLORIDE 0.9% FLUSH
10.0000 mL | INTRAVENOUS | Status: DC | PRN
  Administered 2024-04-13: 10 mL

## 2024-04-10 MED ORDER — CHLORHEXIDINE GLUCONATE 0.12 % MT SOLN
15.0000 mL | Freq: Once | OROMUCOSAL | Status: AC
  Administered 2024-04-10: 15 mL via OROMUCOSAL

## 2024-04-10 MED ORDER — EPHEDRINE SULFATE-NACL 50-0.9 MG/10ML-% IV SOSY
PREFILLED_SYRINGE | INTRAVENOUS | Status: DC | PRN
  Administered 2024-04-10: 7.5 mg via INTRAVENOUS

## 2024-04-10 MED ORDER — CHLORHEXIDINE GLUCONATE 4 % EX SOLN: 60.0000 mL | Freq: Once | CUTANEOUS | Status: DC

## 2024-04-10 MED ORDER — MIDAZOLAM HCL 2 MG/2ML IJ SOLN
INTRAMUSCULAR | Status: AC
  Filled 2024-04-10: qty 2

## 2024-04-10 MED ORDER — SODIUM CHLORIDE 0.9 % IR SOLN
Status: DC | PRN
  Administered 2024-04-10: 3000 mL

## 2024-04-10 MED ORDER — METOCLOPRAMIDE HCL 5 MG/ML IJ SOLN: 5.0000 mg | Freq: Three times a day (TID) | INTRAMUSCULAR | Status: DC | PRN

## 2024-04-10 MED ORDER — SODIUM CHLORIDE (PF) 0.9 % IJ SOLN
INTRAMUSCULAR | Status: DC | PRN
  Administered 2024-04-10: 30 mL

## 2024-04-10 MED ORDER — TRANEXAMIC ACID-NACL 1000-0.7 MG/100ML-% IV SOLN
1000.0000 mg | Freq: Once | INTRAVENOUS | Status: AC
  Administered 2024-04-10: 1000 mg via INTRAVENOUS
  Filled 2024-04-10: qty 100

## 2024-04-10 MED ORDER — KETOROLAC TROMETHAMINE 30 MG/ML IJ SOLN
30.0000 mg | Freq: Once | INTRAMUSCULAR | Status: AC | PRN
  Administered 2024-04-10: 30 mg via INTRAVENOUS

## 2024-04-10 MED ORDER — FENTANYL CITRATE (PF) 100 MCG/2ML IJ SOLN
INTRAMUSCULAR | Status: DC | PRN
  Administered 2024-04-10: 50 ug via INTRAVENOUS
  Administered 2024-04-10 (×2): 25 ug via INTRAVENOUS
  Administered 2024-04-10: 75 ug via INTRAVENOUS
  Administered 2024-04-10: 25 ug via INTRAVENOUS

## 2024-04-10 MED ORDER — SENNA 8.6 MG PO TABS
2.0000 | ORAL_TABLET | Freq: Every day | ORAL | Status: DC
Start: 2024-04-10 — End: 2024-04-13
  Administered 2024-04-10 – 2024-04-12 (×3): 17.2 mg via ORAL
  Filled 2024-04-10 (×4): qty 2

## 2024-04-10 MED ORDER — MIDAZOLAM HCL 5 MG/5ML IJ SOLN
INTRAMUSCULAR | Status: DC | PRN
  Administered 2024-04-10: 2 mg via INTRAVENOUS

## 2024-04-10 MED ORDER — VANCOMYCIN HCL 1000 MG IV SOLR
INTRAVENOUS | Status: DC | PRN
  Administered 2024-04-10: 1000 mg

## 2024-04-10 MED ORDER — ONDANSETRON HCL 4 MG/2ML IJ SOLN: 4.0000 mg | Freq: Four times a day (QID) | INTRAMUSCULAR | Status: DC | PRN

## 2024-04-10 MED ORDER — CHLORHEXIDINE GLUCONATE CLOTH 2 % EX PADS
6.0000 | MEDICATED_PAD | Freq: Every day | CUTANEOUS | Status: DC
Start: 2024-04-11 — End: 2024-04-13
  Administered 2024-04-11 – 2024-04-13 (×3): 6 via TOPICAL

## 2024-04-10 MED ORDER — BISACODYL 10 MG RE SUPP: 10.0000 mg | Freq: Every day | RECTAL | Status: DC | PRN

## 2024-04-10 MED ORDER — METHOCARBAMOL 1000 MG/10ML IJ SOLN: 500.0000 mg | Freq: Four times a day (QID) | INTRAMUSCULAR | Status: DC | PRN

## 2024-04-10 MED ORDER — METOCLOPRAMIDE HCL 5 MG PO TABS: 5.0000 mg | ORAL_TABLET | Freq: Three times a day (TID) | ORAL | Status: DC | PRN

## 2024-04-10 MED ORDER — DEXAMETHASONE SODIUM PHOSPHATE 10 MG/ML IJ SOLN
10.0000 mg | Freq: Once | INTRAMUSCULAR | Status: AC
Start: 2024-04-11 — End: 2024-04-11
  Administered 2024-04-11: 10 mg via INTRAVENOUS
  Filled 2024-04-10: qty 1

## 2024-04-10 SURGICAL SUPPLY — 40 items
ATTUNE PSRP INSR SZ5 6 KNEE (Insert) IMPLANT
BAG COUNTER SPONGE SURGICOUNT (BAG) IMPLANT
BAG ZIPLOCK 12X15 (MISCELLANEOUS) ×2 IMPLANT
BNDG ELASTIC 4INX 5YD STR LF (GAUZE/BANDAGES/DRESSINGS) IMPLANT
BNDG ELASTIC 6INX 5YD STR LF (GAUZE/BANDAGES/DRESSINGS) ×2 IMPLANT
BNDG ELASTIC 6X10 VLCR STRL LF (GAUZE/BANDAGES/DRESSINGS) IMPLANT
COVER SURGICAL LIGHT HANDLE (MISCELLANEOUS) ×2 IMPLANT
CUFF TRNQT CYL 34X4.125X (TOURNIQUET CUFF) ×2 IMPLANT
DERMABOND ADVANCED .7 DNX12 (GAUZE/BANDAGES/DRESSINGS) IMPLANT
DRAPE INCISE IOBAN 66X45 STRL (DRAPES) ×6 IMPLANT
DRAPE U-SHAPE 47X51 STRL (DRAPES) ×2 IMPLANT
DRSG AQUACEL AG ADV 3.5X10 (GAUZE/BANDAGES/DRESSINGS) IMPLANT
DRSG AQUACEL AG ADV 3.5X14 (GAUZE/BANDAGES/DRESSINGS) IMPLANT
DURAPREP 26ML APPLICATOR (WOUND CARE) ×4 IMPLANT
ELECT REM PT RETURN 15FT ADLT (MISCELLANEOUS) ×2 IMPLANT
GLOVE BIOGEL PI IND STRL 7.5 (GLOVE) ×4 IMPLANT
GLOVE BIOGEL PI IND STRL 8.5 (GLOVE) ×2 IMPLANT
GLOVE ECLIPSE 8.0 STRL XLNG CF (GLOVE) ×2 IMPLANT
GLOVE INDICATOR 6.5 STRL GRN (GLOVE) ×2 IMPLANT
GOWN STRL REUS W/ TWL LRG LVL3 (GOWN DISPOSABLE) ×4 IMPLANT
KIT STIMULAN RAPID CURE 10CC (Orthopedic Implant) IMPLANT
KIT TURNOVER KIT A (KITS) ×2 IMPLANT
MANIFOLD NEPTUNE II (INSTRUMENTS) ×2 IMPLANT
NS IRRIG 1000ML POUR BTL (IV SOLUTION) ×2 IMPLANT
PACK TOTAL KNEE CUSTOM (KITS) ×2 IMPLANT
PROTECTOR NERVE ULNAR (MISCELLANEOUS) ×2 IMPLANT
SET HNDPC FAN SPRY TIP SCT (DISPOSABLE) ×2 IMPLANT
SET PAD KNEE POSITIONER (MISCELLANEOUS) ×2 IMPLANT
SOLUTION PRONTOSAN WOUND 350ML (IRRIGATION / IRRIGATOR) ×2 IMPLANT
SPIKE FLUID TRANSFER (MISCELLANEOUS) ×2 IMPLANT
STAPLER SKIN PROX 35W (STAPLE) IMPLANT
SUT MNCRL AB 3-0 PS2 18 (SUTURE) IMPLANT
SUT STRATAFIX PDS+ 0 24IN (SUTURE) ×2 IMPLANT
SUT VIC AB 0 CT1 36 (SUTURE) ×2 IMPLANT
SUT VIC AB 1 CT1 36 (SUTURE) ×2 IMPLANT
SUT VIC AB 2-0 CT1 TAPERPNT 27 (SUTURE) ×6 IMPLANT
SWAB COLLECTION DEVICE MRSA (MISCELLANEOUS) IMPLANT
SWAB CULTURE ESWAB REG 1ML (MISCELLANEOUS) IMPLANT
TRAY FOLEY MTR SLVR 16FR STAT (SET/KITS/TRAYS/PACK) ×2 IMPLANT
WRAP KNEE MAXI GEL POST OP (GAUZE/BANDAGES/DRESSINGS) ×2 IMPLANT

## 2024-04-10 NOTE — ED Triage Notes (Signed)
 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  Noted brady using pulse ox.  Seen by cardiologist Tuesday after episode on Monday is scheduled for stress test.  Patient did not lose consciousness.

## 2024-04-10 NOTE — ED Provider Notes (Signed)
 [place] EMERGENCY DEPARTMENT AT [facility] Provider Note   CSN: [id number] Arrival date & time: 04/10/24  9173     Patient presents with: Near Syncope (Bradycardia/)   Parker Gould is a 53 y.o. male.    Near Syncope     Patient has a history of hypertension hypercholesterolemia.  He presents to the ED for evaluation after a near syncopal episode.  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  He suddenly became very lightheaded.  He also was diaphoretic.  He felt like he was going to pass out.  Patient did not lose consciousness.  He was brought to the ED.  He did have a similar episode on Monday.  He suddenly felt lightheaded.  He saw a cardiologist and they have scheduled him for stress test and echocardiogram.  Around the same time this began he started developing some knee pain.  Patient initially did not think it was too superior he took some ibuprofen.  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  Today the pain is more severe than it was previously.  He has noticed more fluid.  He has significant pain with range of motion.  No known fevers.  No other symptoms of chest pain.  No calf swelling.  No shortness of breath.  Prior to Admission medications   Medication Sig Start Date End Date Taking? Authorizing Provider  atorvastatin  (LIPITOR) 20 MG tablet Take 20 mg by mouth daily. 09/20/22   [provider]  benazepril  (LOTENSIN ) 10 MG tablet Take 10 mg by mouth daily. 11/19/22   [provider]  esomeprazole (NEXIUM) 20 MG capsule Take 20 mg by mouth daily as needed (acid reflux).    [provider]    Allergies: Patient has no known allergies.    Review of Systems  Cardiovascular:  Positive for near-syncope.    Updated Vital Signs BP 134/76   Pulse 66   Temp 98.1 F (36.7 C)   Resp 16   Wt 80.7 kg   SpO2 96%   BMI 25.54 kg/m   Physical Exam Vitals and nursing note reviewed.  Constitutional:       General: He is not in acute distress.    Appearance: He is well-developed.  HENT:     Head: Normocephalic and atraumatic.     Right Ear: External ear normal.     Left Ear: External ear normal.  Eyes:     General: No scleral icterus.       Right eye: No discharge.        Left eye: No discharge.     Conjunctiva/sclera: Conjunctivae normal.  Neck:     Trachea: No tracheal deviation.  Cardiovascular:     Rate and Rhythm: Normal rate and regular rhythm.  Pulmonary:     Effort: Pulmonary effort is normal. No respiratory distress.     Breath sounds: Normal breath sounds. No stridor. No wheezing or rales.  Abdominal:     General: Bowel sounds are normal. There is no distension.     Palpations: Abdomen is soft.     Tenderness: There is no abdominal tenderness. There is no guarding or rebound.  Musculoskeletal:        General: No deformity.     Cervical back: Neck supple.     Left knee: Effusion present. Decreased range of motion. Tenderness present.     Comments: No calf tenderness, strong DP pulse  Skin:  General: Skin is warm and dry.     Findings: No rash.  Neurological:     General: No focal deficit present.     Mental Status: He is alert.     Cranial Nerves: No cranial nerve deficit, dysarthria or facial asymmetry.     Sensory: No sensory deficit.     Motor: No abnormal muscle tone or seizure activity.     Coordination: Coordination normal.  Psychiatric:        Mood and Affect: Mood normal.     (all labs ordered are listed, but only abnormal results are displayed) Labs Reviewed  CBC WITH DIFFERENTIAL/PLATELET - Abnormal; Notable for the following components:      Result Value   WBC 12.6 (*)    Neutro Abs 9.2 (*)    Monocytes Absolute 1.1 (*)    All other components within normal limits  SYNOVIAL CELL COUNT + DIFF, W/ CRYSTALS - Abnormal; Notable for the following components:   Color, Synovial AMBER (*)    Appearance-Synovial CLOUDY (*)    WBC, Synovial 34,600  (*)    Neutrophil, Synovial 93 (*)    Monocyte-Macrophage-Synovial Fluid 7 (*)    All other components within normal limits  CBG MONITORING, ED - Abnormal; Notable for the following components:   Glucose-Capillary 104 (*)    All other components within normal limits  BODY FLUID CULTURE W GRAM STAIN  COMPREHENSIVE METABOLIC PANEL WITH GFR  SEDIMENTATION RATE  C-REACTIVE PROTEIN  GLUCOSE, BODY FLUID OTHER            PROTEIN, BODY FLUID (OTHER)  URIC ACID, BODY FLUID  TROPONIN T, HIGH SENSITIVITY  TROPONIN T, HIGH SENSITIVITY    EKG: EKG Interpretation Date/Time:  Friday April 10 2024 08:32:48 EDT Ventricular Rate:  87 PR Interval:  166 QRS Duration:  88 QT Interval:  347 QTC Calculation: 418 R Axis:   43  Text Interpretation: Sinus rhythm Confirmed by Randol Simmonds [phone]) on 04/10/2024 10:13:54 AM  Radiology: [facility] Knee 2 Views Left Result Date: 04/10/2024 CLINICAL DATA:  Posttraumatic knee pain. EXAM: LEFT KNEE - 1-2 VIEW COMPARISON:  None Available. FINDINGS: Status post total knee arthroplasty. The hardware appears intact, without loosening. No evidence of acute fracture or dislocation. Possible small intra-articular loose bodies versus hydroxyapatite deposition. Small knee joint effusion. IMPRESSION: No evidence of acute fracture or dislocation. Small knee joint effusion. Electronically Signed   By: Elsie Perone M.D.   On: 04/10/2024 09:53   DG Chest Portable 1 View Result Date: 04/10/2024 CLINICAL DATA:  Syncopal episode.  Dizziness. EXAM: PORTABLE CHEST 1 VIEW COMPARISON:  Radiographs 11/09/2011. FINDINGS: 0910 hours. Two views submitted. The heart size and mediastinal contours are normal. The lungs are clear. There is no pleural effusion or pneumothorax. No acute osseous findings are identified. Telemetry leads overlie the chest. IMPRESSION: No active cardiopulmonary process. Electronically Signed   By: Elsie Perone M.D.   On: 04/10/2024 09:52     .Joint  Aspiration/Arthrocentesis  Date/Time: 04/10/2024 9:49 AM  Performed by: Randol Simmonds, MD Authorized by: Randol Simmonds, MD   Consent:    Consent obtained:  Verbal   Consent given by:  Patient   Risks discussed:  Bleeding, infection, nerve damage, incomplete drainage and pain Universal protocol:    Procedure explained and questions answered to patient or proxy's satisfaction: yes     Patient identity confirmed:  Verbally with patient Location:    Location:  Knee   Knee:  L knee Anesthesia:  Anesthesia method:  Local infiltration   Local anesthetic:  Lidocaine  2% WITH epi Procedure details:    Preparation: Patient was prepped and draped in usual sterile fashion     Needle gauge:  18 G   Ultrasound guidance: no     Approach:  Lateral   Aspirate amount:  50   Aspirate characteristics:  Blood-tinged and cloudy   Steroid injected: no     Specimen collected: yes   Post-procedure details:    Dressing:  Adhesive bandage   Procedure completion:  Tolerated well, no immediate complications    Medications Ordered in the ED  lidocaine -EPINEPHrine  (XYLOCAINE  W/EPI) 2 %-1:100000 (with pres) injection 20 mL (20 mLs Infiltration Given 04/10/24 0929)  lactated ringers  bolus 1,000 mL (1,000 mLs Intravenous New Bag/Given 04/10/24 0923)    Clinical Course as of 04/10/24 1247  Fri Apr 10, 2024  0903 Case discussed with Dr Ernie.  Will proceed with arthrocentesis. [JK]  1013 Chest x-ray knee x-ray without acute abnormalities other than small effusion noted on the [JK]    Clinical Course User Index [JK] Randol Simmonds, MD                                 Medical Decision Making Problems Addressed: Effusion of left knee: acute illness or injury that poses a threat to life or bodily functions Syncope, unspecified syncope type: acute illness or injury that poses a threat to life or bodily functions  Amount and/or Complexity of Data Reviewed Labs: ordered. Decision-making details documented in ED  Course. Radiology: ordered and independent interpretation performed.  Risk Prescription drug management. Decision regarding hospitalization.   Patient presented to the ED for evaluation of of a near syncopal episode.  This occurred in the setting of having new onset knee pain and effusion and pain knee status post total knee replacement  Patient has had 2 near syncopal episodes in the past week.  He has seen cardiology and plan was outpatient echocardiogram.  Presentation most likely vasovagal in nature.  No bradycardia dysrhythmia noted in the ED.  No electrolyte abnormalities noted.  Troponins normal.  However I do think he would benefit from an echocardiogram and continuous cardiac monitoring while hospitalized.  Patient's knee effusion is concerning for the possibility of septic arthritis.  Patient does have signs of elevated white blood cell count of 34,000.  He also has 93% neutrophils and no evidence of crystals.  After following plans on taking PA Camplin to the OR for washout and further evaluation.  Patient will be admitted to the hospital for further treatment     Final diagnoses:  Syncope, unspecified syncope type  Inflammatory arthritis    ED Discharge Orders     None          Randol Simmonds, MD 04/10/24 1252

## 2024-04-10 NOTE — Discharge Instructions (Signed)

## 2024-04-10 NOTE — H&P (Signed)
 Parker Gould is an 53 y.o. male.    Chief Complaint: Acute onset left knee pain with history of left total knee replacement from November 2020.  HPI: Pt is a 53 y.o. male contacted me this morning with onset of acute left knee pain beginning yesterday afternoon.  He has not had recent procedures.  He did not have any recent injuries.  He does not recall any specific recent illnesses.  He has had pain but no reports of any fevers chills or night sweats.  After our contact he tried to perform normal work duties but needed to be transferred to the emergency room based on becoming diaphoretic.  Aspiration of his left knee in the emergency room revealed 34,000 white cells with a left shift with 93% neutrophils.  PCP:  Pcp, No  D/C Plans: To be determined following appropriate treatment plan  PMH: Past Medical History:  Diagnosis Date   Arthritis    LEFT knee   GERD (gastroesophageal reflux disease)    PRN OTC meds   Hyperlipidemia    on meds   Hypertension    on meds    PSH: Past Surgical History:  Procedure Laterality Date   ARTHROSCOPIC REPAIR ACL Left    x 2   KNEE ARTHROSCOPY Left    x 2   TOTAL KNEE ARTHROPLASTY Left 2019    Social History:  reports that he has never smoked. He has never used smokeless tobacco. He reports that he does not currently use alcohol. He reports that he does not use drugs.  Allergies:  No Known Allergies  Medications: Medications Prior to Admission  Medication Sig Dispense Refill   atorvastatin  (LIPITOR) 20 MG tablet Take 20 mg by mouth daily.     benazepril  (LOTENSIN ) 10 MG tablet Take 10 mg by mouth daily.     esomeprazole (NEXIUM) 20 MG capsule Take 20 mg by mouth daily as needed (acid reflux).      Results for orders placed or performed during the hospital encounter of 04/10/24 (from the past 48 hours)  Troponin T, High Sensitivity     Status: None   Collection Time: 04/10/24  8:45 AM  Result Value Ref Range   Troponin T High  Sensitivity <15 0 - 19 ng/L    Comment: (NOTE) Biotin concentrations > 1000 ng/mL falsely decrease TnT results.  Serial cardiac troponin measurements are suggested.  Refer to the Links section for chest pain algorithms and additional  guidance. Performed at [facility], [address] [place], [place] [address]   CBC WITH DIFFERENTIAL     Status: Abnormal   Collection Time: 04/10/24  8:53 AM  Result Value Ref Range   WBC 12.6 (H) 4.0 - 10.5 K/uL   RBC 5.06 4.22 - 5.81 MIL/uL   Hemoglobin 15.0 13.0 - 17.0 g/dL   HCT 53.8 60.9 - 47.9 %   MCV 91.1 80.0 - 100.0 fL   MCH 29.6 26.0 - 34.0 pg   MCHC 32.5 30.0 - 36.0 g/dL   RDW 87.3 88.4 - 84.4 %   Platelets 284 150 - 400 K/uL   nRBC 0.0 0.0 - 0.2 %   Neutrophils Relative % 74 %   Neutro Abs 9.2 (H) 1.7 - 7.7 K/uL   Lymphocytes Relative 17 %   Lymphs Abs 2.1 0.7 - 4.0 K/uL   Monocytes Relative 9 %   Monocytes Absolute 1.1 (H) 0.1 - 1.0 K/uL   Eosinophils Relative 0 %   Eosinophils Absolute  0.1 0.0 - 0.5 K/uL   Basophils Relative 0 %   Basophils Absolute 0.0 0.0 - 0.1 K/uL   Immature Granulocytes 0 %   Abs Immature Granulocytes 0.04 0.00 - 0.07 K/uL    Comment: Performed at [facility], [address], [place], [place] [address]  Comprehensive metabolic panel     Status: None   Collection Time: 04/10/24  8:53 AM  Result Value Ref Range   Sodium 137 135 - 145 mmol/L   Potassium 4.0 3.5 - 5.1 mmol/L   Chloride 102 98 - 111 mmol/L   CO2 24 22 - 32 mmol/L   Glucose, Bld 94 70 - 99 mg/dL    Comment: Glucose reference range applies only to samples taken after fasting for at least 8 hours.   BUN 11 6 - 20 mg/dL   Creatinine, Ser 8.81 0.61 - 1.24 mg/dL   Calcium  9.8 8.9 - 10.3 mg/dL   Total Protein 7.2 6.5 - 8.1 g/dL   Albumin 4.6 3.5 - 5.0 g/dL   AST 22 15 - 41 U/L    Comment: HEMOLYSIS AT THIS LEVEL MAY AFFECT RESULT   ALT 32 0 - 44 U/L   Alkaline Phosphatase 80 38 - 126 U/L   Total  Bilirubin 1.0 0.0 - 1.2 mg/dL   GFR, Estimated >39 >39 mL/min    Comment: (NOTE) Calculated using the CKD-EPI Creatinine Equation (2021)    Anion gap 12 5 - 15    Comment: Performed at [facility], [address], [place], [place] [address]  Sedimentation rate     Status: None   Collection Time: 04/10/24  8:53 AM  Result Value Ref Range   Sed Rate 5 0 - 16 mm/hr    Comment: Performed at [facility], [address] [place], [place] [address]  Body fluid culture w Gram Stain     Status: None (Preliminary result)   Collection Time: 04/10/24  8:54 AM   Specimen: Synovium; Body Fluid  Result Value Ref Range   Specimen Description      SYNOVIAL Performed at [facility], [address], [place], [place] [address]    Special Requests      KNEE Performed at [facility], [address] [place], [place] [address]    Gram Stain      MODERATE WBC PRESENT, PREDOMINANTLY PMN NO ORGANISMS SEEN Performed at [facility], [address], [place], [place] [address]    Culture PENDING    Report Status PENDING   Synovial cell count + diff, w/ crystals     Status: Abnormal   Collection Time: 04/10/24  8:54 AM  Result Value Ref Range   Color, Synovial AMBER (A) YELLOW   Appearance-Synovial CLOUDY (A) CLEAR   Crystals, Fluid NO CRYSTALS SEEN    WBC, Synovial 34,600 (H) 0 - 200 /cu mm   Neutrophil, Synovial 93 (H) 0 - 25 %   Lymphocytes-Synovial Fld 0 0 - 20 %   Monocyte-Macrophage-Synovial Fluid 7 (L) 50 - 90 %   Eosinophils-Synovial 0 0 - 1 %    Comment: Performed at [facility], [address], [place], [place] [address]  CBG monitoring, ED     Status: Abnormal   Collection Time: 04/10/24  9:30 AM  Result Value Ref Range   Glucose-Capillary 104 (H) 70 - 99 mg/dL    Comment: Glucose reference range applies only to samples taken after fasting  for at least 8 hours.  C-reactive protein      Status: Abnormal   Collection Time: 04/10/24 10:45 AM  Result Value Ref Range   CRP 5.8 (H) <1.0 mg/dL    Comment: Performed at [facility], [address] [place], [place] [address]  Troponin T, High Sensitivity     Status: None   Collection Time: 04/10/24 10:45 AM  Result Value Ref Range   Troponin T High Sensitivity <15 0 - 19 ng/L    Comment: (NOTE) Biotin concentrations > 1000 ng/mL falsely decrease TnT results.  Serial cardiac troponin measurements are suggested.  Refer to the Links section for chest pain algorithms and additional  guidance. Performed at [facility], [address], [place], [place] [address]   Type and screen Order type and screen if day of surgery is less than 15 days from draw of preadmission visit or order morning of surgery if day of surgery is greater than 6 days from preadmission visit.     Status: None   Collection Time: 04/10/24  3:30 PM  Result Value Ref Range   ABO/RH(D) O NEG    Antibody Screen NEG    Sample Expiration      04/13/2024,2359 Performed at [facility], [address], [place], [place] [address]   ABO/Rh     Status: None   Collection Time: 04/10/24  3:35 PM  Result Value Ref Range   ABO/RH(D)      MALVA NEG Performed at [facility], [address], [place], [place] [address]    US  EKG SITE RITE Result Date: 04/10/2024 If Site Rite image not attached, placement could not be confirmed due to current cardiac rhythm.  DG Knee 2 Views Left Result Date: 04/10/2024 CLINICAL DATA:  Posttraumatic knee pain. EXAM: LEFT KNEE - 1-2 VIEW COMPARISON:  None Available. FINDINGS: Status post total knee arthroplasty. The hardware appears intact, without loosening. No evidence of acute fracture or dislocation. Possible small intra-articular loose bodies versus hydroxyapatite deposition. Small knee joint effusion. IMPRESSION: No evidence of acute fracture or dislocation. Small knee joint  effusion. Electronically Signed   By: Elsie Perone M.D.   On: 04/10/2024 09:53   DG Chest Portable 1 View Result Date: 04/10/2024 CLINICAL DATA:  Syncopal episode.  Dizziness. EXAM: PORTABLE CHEST 1 VIEW COMPARISON:  Radiographs 11/09/2011. FINDINGS: 0910 hours. Two views submitted. The heart size and mediastinal contours are normal. The lungs are clear. There is no pleural effusion or pneumothorax. No acute osseous findings are identified. Telemetry leads overlie the chest. IMPRESSION: No active cardiopulmonary process. Electronically Signed   By: Elsie Perone M.D.   On: 04/10/2024 09:52    ROS: Review of Systems - Negative except for his presentation  Physican Exam: Blood pressure 128/80, pulse 78, temperature 99.6 F (37.6 C), temperature source Oral, resp. rate 18, height 5' 10 (1.778 m), weight 80.7 kg, SpO2 98%.  Very pleasant healthy 53 year old male awake alert and oriented Chest clear Heart regular rate Abdomen soft and nontender  Left knee exam: The surgical incision is healed Mild reactive erythema with some warmth Perhaps mild effusion despite aspiration in the emergency room earlier today No significant lower extremity edema, erythema or calf tenderness    Assessment/Plan Assessment: Concern for acute hematogenous infection involving his left knee following arthroplasty in November 2020  Plan: Based on his presentation of acute onset left knee pain and the aspiration results we will take him to the operating  room to perform an I&D with polyethylene exchange.  His C-reactive protein is mildly elevated where his sedimentation rate is normal.  We would not anticipate a white blood cell count with aspiration of 34,000 in the setting of pure inflammation and thus infection is a primary concern.  We will treat this aggressively and await culture results to select appropriate antibiotics.  We have put an order in for a PICC line.  Antibiotic selection will be based on his  culture results.  We will empirically start him on vancomycin  and ceftriaxone postoperatively.  We will consult infectious disease to get their input and selection based on cultures.   Donnice CORDOBA Ernie, MD  04/10/2024, 4:52 PM

## 2024-04-10 NOTE — Progress Notes (Addendum)
 Pharmacy Antibiotic Note  Parker Gould is a 53 y.o. male admitted on 04/10/2024 with acute L knee pain, concerning for prosthetic joint infection. Hx of L knee arthoplasty in 05/2019 and is now s/p I&D on 9/26.  Pharmacy has been consulted for vancomycin  dosing.  Mild leukocytosis, Scr wnl, afebrile  Plan: Vancomycin  1750mg  IV x1 load, followed by 1000mg  IV q12h (eAUC 501, Scr 1.18, IBW, Vd 0.72) Monitor renal function, clinical status Follow up cultures, length of therapy, levels as appropriate  Height: 5' 10 (177.8 cm) Weight: 80.7 kg (177 lb 14.6 oz) IBW/kg (Calculated) : 73  Temp (24hrs), Avg:98.7 F (37.1 C), Min:98.1 F (36.7 C), Max:99.6 F (37.6 C)  Recent Labs  Lab 04/10/24 0853  WBC 12.6*  CREATININE 1.18    Estimated Creatinine Clearance: 77.3 mL/min (by C-G formula based on SCr of 1.18 mg/dL).    No Known Allergies  Antimicrobials this admission: Ceftriaxone 9/26 >> Vancomycin  9/26 >>   Dose adjustments this admission: None  Microbiology results: 9/26 synovial fluid cx: no organisms seen; pending  Thank you for allowing pharmacy to be a part of this patient's care.  Jarome Trull 04/10/2024 7:05 PM

## 2024-04-10 NOTE — Brief Op Note (Signed)
 04/10/2024  6:28 PM  PATIENT:  Debby KATHEE Fireman  53 y.o. male  PRE-OPERATIVE DIAGNOSIS:  Acute periprosthetic joint infection  POST-OPERATIVE DIAGNOSIS:  Acute periprosthetic joint infection  PROCEDURE:  Procedure(s): IRRIGATION AND DEBRIDEMENT KNEE (Left) Debridement with antibiotics and implant rentention  SURGEON:  Surgeons and Role:    DEWAINE Ernie Cough, MD - Primary  PHYSICIAN ASSISTANT: Rosina Calin, PA-C  ANESTHESIA:   general  EBL:  25 mL   BLOOD ADMINISTERED:none  DRAINS: none   LOCAL MEDICATIONS USED:  OTHER vancomycin  and tobramycin  on Stimulan beads  SPECIMEN:  No Specimen  DISPOSITION OF SPECIMEN:  N/A  COUNTS:  YES  TOURNIQUET:   Total Tourniquet Time Documented: Thigh (Left) - 27 minutes Total: Thigh (Left) - 27 minutes   DICTATION: .Other Dictation: Dictation Number [id number]  PLAN OF CARE: Admit to inpatient   PATIENT DISPOSITION:  PACU - hemodynamically stable.   Delay start of Pharmacological VTE agent (>24hrs) due to surgical blood loss or risk of bleeding: no

## 2024-04-10 NOTE — ED Notes (Signed)
 Fluid from knee taken to lab and they will separate it into the tubes.  Patient given ice pack.

## 2024-04-10 NOTE — Interval H&P Note (Signed)
 History and Physical Interval Note:  04/10/2024 4:59 PM  Parker Gould  has presented today for surgery, with the diagnosis of Infected Left Knee.  The various methods of treatment have been discussed with the patient and family. After consideration of risks, benefits and other options for treatment, the patient has consented to  Procedure(s): IRRIGATION AND DEBRIDEMENT KNEE (Left) as a surgical intervention.  The patient's history has been reviewed, patient examined, no change in status, stable for surgery.  I have reviewed the patient's chart and labs.  Questions were answered to the patient's satisfaction.     Donnice JONETTA Car

## 2024-04-10 NOTE — Anesthesia Preprocedure Evaluation (Addendum)
 Anesthesia Evaluation  Patient identified by MRN, date of birth, ID band Patient awake    Reviewed: Allergy & Precautions, H&P , Patient's Chart, lab work & pertinent test results  Airway Mallampati: II  TM Distance: >3 FB Neck ROM: Full    Dental no notable dental hx.    Pulmonary neg pulmonary ROS   Pulmonary exam normal        Cardiovascular hypertension, Pt. on medications  Rhythm:Regular Rate:Normal     Neuro/Psych negative neurological ROS  negative psych ROS   GI/Hepatic Neg liver ROS,GERD  Controlled and Medicated,,  Endo/Other  negative endocrine ROS    Renal/GU negative Renal ROS  negative genitourinary   Musculoskeletal  (+) Arthritis , Osteoarthritis,    Abdominal Normal abdominal exam  (+)   Peds negative pediatric ROS (+)  Hematology negative hematology ROS (+) Hb 15, plt 284   Anesthesia Other Findings   Reproductive/Obstetrics negative OB ROS                              Anesthesia Physical Anesthesia Plan  ASA: 2  Anesthesia Plan: General   Post-op Pain Management: Tylenol  PO (pre-op)* and Toradol  IV (intra-op)*   Induction: Intravenous  PONV Risk Score and Plan: 2 and Ondansetron , Dexamethasone , Midazolam  and Treatment may vary due to age or medical condition  Airway Management Planned: LMA and Mask  Additional Equipment: None  Intra-op Plan:   Post-operative Plan: Extubation in OR  Informed Consent: I have reviewed the patients History and Physical, chart, labs and discussed the procedure including the risks, benefits and alternatives for the proposed anesthesia with the patient or authorized representative who has indicated his/her understanding and acceptance.     Dental advisory given  Plan Discussed with: CRNA  Anesthesia Plan Comments:          Anesthesia Quick Evaluation

## 2024-04-10 NOTE — Progress Notes (Signed)
 Peripherally Inserted Central Catheter Placement  The IV Nurse has discussed with the patient and/or persons authorized to consent for the patient, the purpose of this procedure and the potential benefits and risks involved with this procedure.  The benefits include less needle sticks, lab draws from the catheter, and the patient may be discharged home with the catheter. Risks include, but not limited to, infection, bleeding, blood clot (thrombus formation), and puncture of an artery; nerve damage and irregular heartbeat and possibility to perform a PICC exchange if needed/ordered by physician.  Alternatives to this procedure were also discussed.  Bard Power PICC patient education guide, fact sheet on infection prevention and patient information card has been provided to patient /or left at bedside.  Consent signed prior to surgery.  PICC Placement Documentation  PICC Single Lumen 04/10/24 Right Basilic 39 cm 0 cm (Active)  Indication for Insertion or Continuance of Line Prolonged intravenous therapies;Home intravenous therapies (PICC only) 04/10/24 2202  Exposed Catheter (cm) 0 cm 04/10/24 2202  Site Assessment Clean, Dry, Intact 04/10/24 2202  Line Status Flushed;Saline locked;Blood return noted 04/10/24 2202  Dressing Type Transparent;Securing device 04/10/24 2202  Dressing Status Antimicrobial disc/dressing in place;Clean, Dry, Intact 04/10/24 2202  Line Care Connections checked and tightened 04/10/24 2202  Line Adjustment (NICU/IV Team Only) No 04/10/24 2202  Dressing Intervention New dressing;Adhesive placed at insertion site (IV team only) 04/10/24 2202  Dressing Change Due 04/17/24 04/10/24 2202       Adonay Scheier, Cherene Place 04/10/2024, 10:03 PM

## 2024-04-10 NOTE — Anesthesia Procedure Notes (Signed)
 Procedure Name: LMA Insertion Date/Time: 04/10/2024 5:14 PM  Performed by: Roslynn Waddell LABOR, CRNAPre-anesthesia Checklist: Patient identified, Emergency Drugs available, Suction available and Patient being monitored Patient Re-evaluated:Patient Re-evaluated prior to induction Oxygen Delivery Method: Circle System Utilized Preoxygenation: Pre-oxygenation with 100% oxygen Induction Type: IV induction LMA: LMA inserted LMA Size: 4.0 Number of attempts: 1 Airway Equipment and Method: Bite block Placement Confirmation: positive ETCO2 Tube secured with: Tape Dental Injury: Teeth and Oropharynx as per pre-operative assessment  Comments: Atraumatic induction/LMA insertion. Dentition and oral mucosa as per preop.

## 2024-04-10 NOTE — Transfer of Care (Signed)
 Immediate Anesthesia Transfer of Care Note  Patient: Parker Gould  Procedure(s) Performed: IRRIGATION AND DEBRIDEMENT KNEE (Left: Knee)  Patient Location: PACU  Anesthesia Type:General  Level of Consciousness: awake and alert   Airway & Oxygen Therapy: Patient Spontanous Breathing and Patient connected to face mask oxygen  Post-op Assessment: Report given to RN and Post -op Vital signs reviewed and stable  Post vital signs: Reviewed and stable  Last Vitals:  Vitals Value Taken Time  BP 125/73 04/10/24 18:46  Temp 37.1 C 04/10/24 18:46  Pulse 80 04/10/24 18:52  Resp 13 04/10/24 18:52  SpO2 99 % 04/10/24 18:52  Vitals shown include unfiled device data.  Last Pain:  Vitals:   04/10/24 1525  TempSrc:   PainSc: 4       Patients Stated Pain Goal: 2 (04/10/24 1525)  Complications: No notable events documented.

## 2024-04-11 ENCOUNTER — Observation Stay (HOSPITAL_BASED_OUTPATIENT_CLINIC_OR_DEPARTMENT_OTHER)

## 2024-04-11 DIAGNOSIS — Y831 Surgical operation with implant of artificial internal device as the cause of abnormal reaction of the patient, or of later complication, without mention of misadventure at the time of the procedure: Secondary | ICD-10-CM | POA: Diagnosis present

## 2024-04-11 DIAGNOSIS — Z79899 Other long term (current) drug therapy: Secondary | ICD-10-CM | POA: Diagnosis not present

## 2024-04-11 DIAGNOSIS — M25462 Effusion, left knee: Secondary | ICD-10-CM | POA: Diagnosis present

## 2024-04-11 DIAGNOSIS — I10 Essential (primary) hypertension: Secondary | ICD-10-CM | POA: Diagnosis present

## 2024-04-11 DIAGNOSIS — M1712 Unilateral primary osteoarthritis, left knee: Secondary | ICD-10-CM | POA: Diagnosis present

## 2024-04-11 DIAGNOSIS — T8454XD Infection and inflammatory reaction due to internal left knee prosthesis, subsequent encounter: Secondary | ICD-10-CM | POA: Diagnosis not present

## 2024-04-11 DIAGNOSIS — T8454XA Infection and inflammatory reaction due to internal left knee prosthesis, initial encounter: Secondary | ICD-10-CM | POA: Diagnosis present

## 2024-04-11 DIAGNOSIS — R55 Syncope and collapse: Secondary | ICD-10-CM | POA: Diagnosis not present

## 2024-04-11 DIAGNOSIS — E7800 Pure hypercholesterolemia, unspecified: Secondary | ICD-10-CM | POA: Diagnosis present

## 2024-04-11 DIAGNOSIS — E785 Hyperlipidemia, unspecified: Secondary | ICD-10-CM | POA: Diagnosis present

## 2024-04-11 DIAGNOSIS — K219 Gastro-esophageal reflux disease without esophagitis: Secondary | ICD-10-CM | POA: Diagnosis present

## 2024-04-11 DIAGNOSIS — Z96652 Presence of left artificial knee joint: Secondary | ICD-10-CM

## 2024-04-11 LAB — CBC
HCT: 39.6 % (ref 39.0–52.0)
Hemoglobin: 13 g/dL (ref 13.0–17.0)
MCH: 30.7 pg (ref 26.0–34.0)
MCHC: 32.8 g/dL (ref 30.0–36.0)
MCV: 93.4 fL (ref 80.0–100.0)
Platelets: 249 K/uL (ref 150–400)
RBC: 4.24 MIL/uL (ref 4.22–5.81)
RDW: 12.8 % (ref 11.5–15.5)
WBC: 13.9 K/uL — ABNORMAL HIGH (ref 4.0–10.5)
nRBC: 0 % (ref 0.0–0.2)

## 2024-04-11 LAB — ECHOCARDIOGRAM COMPLETE
Area-P 1/2: 2.94 cm2
Height: 70 in
S' Lateral: 3.2 cm
Weight: 2846.58 [oz_av]

## 2024-04-11 LAB — BASIC METABOLIC PANEL WITH GFR
Anion gap: 11 (ref 5–15)
BUN: 11 mg/dL (ref 6–20)
CO2: 22 mmol/L (ref 22–32)
Calcium: 9.5 mg/dL (ref 8.9–10.3)
Chloride: 102 mmol/L (ref 98–111)
Creatinine, Ser: 1.26 mg/dL — ABNORMAL HIGH (ref 0.61–1.24)
GFR, Estimated: >60 mL/min (ref >60)
Glucose, Bld: 166 mg/dL — ABNORMAL HIGH (ref 70–99)
Potassium: 4.6 mmol/L (ref 3.5–5.1)
Sodium: 135 mmol/L (ref 135–145)

## 2024-04-11 MED ORDER — DAPTOMYCIN-SODIUM CHLORIDE 700-0.9 MG/100ML-% IV SOLN
700.0000 mg | Freq: Every day | INTRAVENOUS | Status: DC
Start: 2024-04-11 — End: 2024-04-13
  Administered 2024-04-11 – 2024-04-13 (×3): 700 mg via INTRAVENOUS
  Filled 2024-04-11 (×3): qty 100

## 2024-04-11 MED ORDER — SODIUM CHLORIDE 0.9 % IV SOLN
2.0000 g | Freq: Three times a day (TID) | INTRAVENOUS | Status: DC
  Administered 2024-04-11 – 2024-04-13 (×6): 2 g via INTRAVENOUS
  Filled 2024-04-11 (×6): qty 12.5

## 2024-04-11 NOTE — Plan of Care (Signed)
   Problem: Coping: Goal: Level of anxiety will decrease Outcome: Progressing   Problem: Pain Managment: Goal: General experience of comfort will improve and/or be controlled Outcome: Progressing   Problem: Safety: Goal: Ability to remain free from injury will improve Outcome: Progressing

## 2024-04-11 NOTE — Consult Note (Signed)
 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 Discussed recommendation/literature regarding abx choice/route and duration. Certainly nothing less than 3 months for duration, but would likely to go beyond 1-2 years for this   Plan: Change abx to empiric dapto cefepime F/u operative cx He would like to have 4-6 weeks iv abx and then transition to oral tail Maintain standard isolation precaution Discussed with primary team      ------------------------------------------------ Principal Problem:   Infection of prosthetic left knee joint    HPI: Parker Gould is a 53 y.o. male  with a few days left pj pain/swelling admitted 9/26 for w/u found to have pji s/p I&D and dair  Patient reports 2 days of acute pain/swelling No trauma/injury/recent medical-dental procedure No recent abx for any acute illness  He had aspiration of the left pj knee in the ed with 34k wbc and 93% neutropil on 9/26 so admitted for urgent I&D --> underwent DAIR  Currently on empiric vanc/ceftriaxone  No smoking No dm2 No immunosuppressive meds/RA  His left pj was the first more than several months ago uncomplicated.  No other complaint   Family History  Problem Relation Age of Onset   Colon cancer Neg Hx    Colon polyps Neg Hx    Esophageal cancer Neg Hx    Stomach cancer Neg Hx    Rectal  cancer Neg Hx     Social History   Tobacco Use   Smoking status: Never   Smokeless tobacco: Never  Vaping Use   Vaping status: Never Used  Substance Use Topics   Alcohol use: Not Currently   Drug use: Never    No Known Allergies  Review of Systems: ROS All Other ROS was negative, except mentioned above   Past Medical History:  Diagnosis Date   Arthritis    LEFT knee   GERD (gastroesophageal reflux disease)    PRN OTC meds   Hyperlipidemia    on meds   Hypertension    on meds       Scheduled Meds:  aspirin   81 mg Oral BID   atorvastatin   20 mg Oral Daily   benazepril   10 mg Oral Daily   Chlorhexidine  Gluconate Cloth  6 each Topical Daily   pantoprazole   40 mg Oral Daily   polyethylene glycol  17 g Oral BID   senna  2 tablet Oral QHS   Continuous Infusions:  sodium chloride  Stopped (04/11/24 0800)   ceFEPime (MAXIPIME) IV 2 g (04/11/24 1119)   DAPTOmycin     PRN Meds:.acetaminophen , alum & mag hydroxide-simeth, bisacodyl , diphenhydrAMINE , HYDROcodone -acetaminophen , HYDROcodone -acetaminophen , HYDROmorphone  (DILAUDID ) injection, menthol  **OR** phenol, methocarbamol  **OR** methocarbamol  (ROBAXIN ) injection, metoCLOPramide  **OR** metoCLOPramide  (REGLAN ) injection, ondansetron  **OR** ondansetron  (ZOFRAN ) IV, sodium chloride  flush  OBJECTIVE: Blood pressure (!) 145/92, pulse 90, temperature 98.2 F (36.8 C), temperature source Oral, resp. rate 17, height 5' 10 (1.778 m), weight 80.7 kg, SpO2 96%.  Physical Exam  General/constitutional: no distress, pleasant HEENT: Normocephalic, PER, Conj Clear, EOMI, Oropharynx clear Neck supple CV: rrr no mrg Lungs: clear to auscultation, normal respiratory effort Abd: Soft, Nontender Ext: no edema Skin: No Rash Neuro: nonfocal MSK: left knee dressing c/d/i    Lab Results Lab Results  Component Value Date   WBC 13.9 (H) 04/11/2024   HGB 13.0 04/11/2024   HCT 39.6 04/11/2024   MCV 93.4 04/11/2024   PLT 249  04/11/2024    Lab Results  Component Value Date   CREATININE 1.26 (H) 04/11/2024   BUN 11 04/11/2024   NA 135 04/11/2024   K 4.6 04/11/2024   CL 102 04/11/2024   CO2 22 04/11/2024    Lab Results  Component Value Date   ALT 32 04/10/2024   AST 22 04/10/2024   ALKPHOS 80 04/10/2024   BILITOT 1.0 04/10/2024      Microbiology: Recent Results (from the past 240 hours)  Body fluid culture w Gram Stain     Status: None (Preliminary result)   Collection Time: 04/10/24  8:54 AM   Specimen: Synovium; Body Fluid  Result Value Ref Range Status   Specimen Description   Final    SYNOVIAL Performed at [facility], [address] [place], [place] [address]    Special Requests   Final    KNEE Performed at [facility], [address], [place], [place] [address]    Gram Stain   Final    MODERATE WBC PRESENT, PREDOMINANTLY PMN NO ORGANISMS SEEN    Culture   Final    NO GROWTH < 24 HOURS Performed at [facility], [address], [place], [place] [address]    Report Status PENDING  Incomplete     Serology:    Imaging: If present, new imagings (plain films, ct scans, and mri) have been personally visualized and interpreted; radiology reports have been reviewed. Decision making incorporated into the Impression / Recommendations.  9/26 xray chest IMPRESSION: No active cardiopulmonary process.   9/26 xray left knee No evidence of acute fracture or dislocation. Small knee joint effusion.  [name], MD [facility] [phone] pager    04/11/2024, 2:03 PM

## 2024-04-11 NOTE — Progress Notes (Signed)
 Pharmacy Antibiotic Note  Parker Gould is a 53 y.o. male admitted on 04/10/2024 with PJI.  Pharmacy has been consulted for Dapt, Cefepime dosing.   ID: periprosthetic joint infection  - 9/26: I&D, revision - Tmax 99.6, WBC 13.9 up, Scr 1.26  Ceftriaxone 9/26 >>9/27 Vancomycin  9/26 >> 9/27 Cefepime 9/27>> Dapto 9/27>>  9/26: Synovial fluid: NGTD  Plan: - PICC line placed 9/26 - Cefepime 2g IV q8hr - Dapto 700mg  IV q24h  (LD Vanco 9/27 AM) - CK weekly     Height: 5' 10 (177.8 cm) Weight: 80.7 kg (177 lb 14.6 oz) IBW/kg (Calculated) : 73  Temp (24hrs), Avg:98.4 F (36.9 C), Min:97.9 F (36.6 C), Max:99.6 F (37.6 C)  Recent Labs  Lab 04/10/24 0853 04/11/24 0323  WBC 12.6* 13.9*  CREATININE 1.18 1.26*    Estimated Creatinine Clearance: 72.4 mL/min (A) (by C-G formula based on SCr of 1.26 mg/dL (H)).    No Known Allergies  Morgaine Kimball Karoline Marina, PharmD, BCPS Clinical Staff Pharmacist  [facility] 04/11/2024 10:59 AM

## 2024-04-11 NOTE — Progress Notes (Signed)
 Patient ID: Parker Gould, male   DOB: [birth year], 53 y.o.   MRN: [id number] Subjective: 1 Day Post-Op Procedure(s) (LRB): IRRIGATION AND DEBRIDEMENT KNEE (Left)    Patient reports pain as mild.  [name] states that his pain is better/different than yesterday.  No events overnight.  Objective:   VITALS:   Vitals:   04/11/24 0300 04/11/24 0533  BP: 128/79 134/75  Pulse: (!) 59 (!) 59  Resp: 18   Temp: 97.9 F (36.6 C) 98.1 F (36.7 C)  SpO2: 96% 96%    Neurovascular intact Incision: dressing C/D/I  LABS Recent Labs    04/10/24 0853 04/11/24 0323  HGB 15.0 13.0  HCT 46.1 39.6  WBC 12.6* 13.9*  PLT 284 249    Recent Labs    04/10/24 0853 04/11/24 0323  NA 137 135  K 4.0 4.6  BUN 11 11  CREATININE 1.18 1.26*  GLUCOSE 94 166*    No results for input(s): LABPT, INR in the last 72 hours.   Assessment/Plan: 1 Day Post-Op Procedure(s) (LRB): IRRIGATION AND DEBRIDEMENT KNEE (Left)   Advance diet Up with therapy  Awaiting cultures, no gram stain yet Will consult ID for assistance with antibiotic selection and duration PIC line placed TOC team to assist with [facility] antibiotics

## 2024-04-11 NOTE — Plan of Care (Signed)
  Problem: Education: Goal: Knowledge of General Education information will improve Description: Including pain rating scale, medication(s)/side effects and non-pharmacologic comfort measures Outcome: Progressing   Problem: Health Behavior/Discharge Planning: Goal: Ability to manage health-related needs will improve Outcome: Progressing   Problem: Clinical Measurements: Goal: Ability to maintain clinical measurements within normal limits will improve Outcome: Progressing Goal: Will remain free from infection Outcome: Progressing Goal: Diagnostic test results will improve Outcome: Progressing Goal: Respiratory complications will improve Outcome: Progressing Goal: Cardiovascular complication will be avoided Outcome: Progressing   Problem: Activity: Goal: Risk for activity intolerance will decrease Outcome: Adequate for Discharge   Problem: Nutrition: Goal: Adequate nutrition will be maintained Outcome: Completed/Met   Problem: Coping: Goal: Level of anxiety will decrease Outcome: Progressing   Problem: Elimination: Goal: Will not experience complications related to bowel motility Outcome: Progressing Goal: Will not experience complications related to urinary retention Outcome: Completed/Met   Problem: Pain Managment: Goal: General experience of comfort will improve and/or be controlled Outcome: Progressing   Problem: Safety: Goal: Ability to remain free from injury will improve Outcome: Progressing   Problem: Skin Integrity: Goal: Risk for impaired skin integrity will decrease Outcome: Adequate for Discharge   Problem: Education: Goal: Knowledge of the prescribed therapeutic regimen will improve Outcome: Completed/Met Goal: Individualized Educational Video(s) Outcome: Completed/Met   Problem: Activity: Goal: Ability to avoid complications of mobility impairment will improve Outcome: Progressing Goal: Range of joint motion will improve Outcome: Progressing    Problem: Clinical Measurements: Goal: Postoperative complications will be avoided or minimized Outcome: Progressing   Problem: Pain Management: Goal: Pain level will decrease with appropriate interventions Outcome: Progressing   Problem: Skin Integrity: Goal: Will show signs of wound healing Outcome: Progressing

## 2024-04-11 NOTE — Evaluation (Signed)
 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 pt is now s/p I &D L knee 03/31/24. PMH: HTN, L TKA  Clinical Impression  Patient evaluated by Physical Therapy with no further acute PT needs identified. All education has been completed and the patient has no further questions.  Pt is doing well today, amb ~ 300' with no device. Pt working on knee ROM on his own as well. Pain overall improved from yesterday per pt report. Encouraged to continue mobility. No further PT needs at this time.  See below for any follow-up Physical Therapy or equipment needs. PT is signing off. Thank you for this referral.         If plan is discharge home, recommend the following:     Can travel by private vehicle        Equipment Recommendations None recommended by PT  Recommendations for Other Services       Functional Status Assessment Patient has not had a recent decline in their functional status     Precautions / Restrictions Precautions Precautions: None Restrictions Weight Bearing Restrictions Per Provider Order: No Other Position/Activity Restrictions: WBAT      Mobility  Bed Mobility Overal bed mobility: Independent                  Transfers Overall transfer level: Independent                      Ambulation/Gait Ambulation/Gait assistance: Supervision Gait Distance (Feet): 300 Feet Assistive device: None Gait Pattern/deviations: Step-through pattern, Wide base of support       General Gait Details: decr hip and knee flexion on L, improving with distance. steady gait without device. supervision only for lines  Stairs            Wheelchair Mobility     Tilt Bed    Modified Rankin (Stroke Patients Only)       Balance Overall balance  assessment: No apparent balance deficits (not formally assessed)                                           Pertinent Vitals/Pain Pain Assessment Pain Assessment: Faces Faces Pain Scale: Hurts little more Pain Location: L knee Pain Descriptors / Indicators: Guarding Pain Intervention(s): Limited activity within patient's tolerance, Monitored during session, Premedicated before session, Repositioned, Ice applied    Home Living Family/patient expects to be discharged to:: Private residence Living Arrangements: Spouse/significant other;Children Available Help at Discharge: Family                    Prior Function Prior Level of Function : Independent/Modified Independent                     Extremity/Trunk Assessment   Upper Extremity Assessment Upper Extremity Assessment: Overall WFL for tasks assessed    Lower Extremity Assessment Lower Extremity Assessment: LLE deficits/detail LLE Deficits / Details: knee flexion ~75 degrees AAROM; strength WFL       Communication   Communication Communication: No apparent difficulties    Cognition Arousal: Alert Behavior During Therapy: WFL for tasks assessed/performed   PT - Cognitive impairments: No apparent  impairments                         Following commands: Intact       Cueing Cueing Techniques: Verbal cues     General Comments      Exercises Total Joint Exercises Knee Flexion: AAROM, Left, 5 reps, Seated   Assessment/Plan    PT Assessment Patient does not need any further PT services  PT Problem List         PT Treatment Interventions      PT Goals (Current goals can be found in the Care Plan section)  Acute Rehab PT Goals PT Goal Formulation: All assessment and education complete, DC therapy    Frequency       Co-evaluation               AM-PAC PT 6 Clicks Mobility  Outcome Measure Help needed turning from your back to your side while in a flat  bed without using bedrails?: None Help needed moving from lying on your back to sitting on the side of a flat bed without using bedrails?: None Help needed moving to and from a bed to a chair (including a wheelchair)?: None Help needed standing up from a chair using your arms (e.g., wheelchair or bedside chair)?: None Help needed to walk in hospital room?: None Help needed climbing 3-5 steps with a railing? : None 6 Click Score: 24    End of Session   Activity Tolerance: Patient tolerated treatment well Patient left: with call bell/phone within reach;in bed   PT Visit Diagnosis: Other abnormalities of gait and mobility (R26.89)    Time: 9049-8988 PT Time Calculation (min) (ACUTE ONLY): 21 min   Charges:   PT Evaluation $PT Eval Low Complexity: 1 Low   PT General Charges $$ ACUTE PT VISIT: 1 Visit         Aprill Banko, PT  Acute Rehab Dept [facility]) [phone]  04/11/2024   [facility] 04/11/2024, 11:34 AM

## 2024-04-11 NOTE — Op Note (Signed)
 NAME: Parker Gould, Parker Gould MEDICAL RECORD NO: [id number] ACCOUNT NO: [id number] DATE OF BIRTH: [birth year] FACILITY: [facility] LOCATION: [id number] PHYSICIAN: Donnice BIRCH. Ernie, MD  Operative Report   DATE OF PROCEDURE: 04/10/2024  PREOPERATIVE DIAGNOSIS: Acute periprosthetic joint infection.  POSTOPERATIVE DIAGNOSIS: Acute periprosthetic joint infection.  PROCEDURE: 1. Excisional and non-excisional debridement of left knee with polyethylene revision/exchange.  Note that the excisional debridement was carried out over a 6 to 8 inch incision and included sharply excising the skin with a scalpel and subsequent nonviable and scar tissue, some tendinous structure, and minimal bone.  Non-excisional debridement was carried out with normal saline solution with pulse lavage, 1 bottle of Surgiphor iodine -based product as well as a bottle of the Prontosan antimicrobial solution.  2. Polyethylene revision using a size 5 x 6 mm insert to match the 5 femur.  SURGEON: Donnice BIRCH. Ernie, MD  ASSISTANT: Rosina Calin, PA-C. Note that PA, Calin, was present for the entirety of the case and preoperative positioning, perioperative management of the operative extremity, general facilitation of the case, and primary wound closure.  ANESTHESIA: General.  BLOOD LOSS: Less than 100 mL.  TOURNIQUET: Up for 27 minutes at 225 mmHg.  COMPLICATIONS: None apparent.  INDICATIONS FOR THE PROCEDURE: The patient is a very pleasant, healthy 53 year old male who has a history of a left total knee replacement from 05/2019. He was doing well and in his normal state of health until the afternoon of 04/09/2024, where he had  relatively acute onset of pain and mild swelling, not associated with fevers or chills. He had contacted me this morning on 04/10/2024, with this report. We discussed observing and seeing how he proceeded over the weekend; however, when he tried to go to  work, he became diaphoretic. He was taken to the Emergency  Room. His aspiration of his left knee indicated white blood cell count of 34,000. There was a left shift with neutrophils of 93%. His C-reactive protein was elevated. His sedimentation rate was  normal. Given these findings including his clinical presentation, exam findings, and aspiration results, we determined that he needed to go to the operating room for urgent excisional, non-excisional debridement, polyethylene exchange with the goal of  implant retention. Consent was obtained after reviewing the risks of this type of treatment in terms of management of his infection. We also discussed postoperative care and the use for a PICC line and IV antibiotics for 6 weeks followed by 3-6 months of  oral antibiotics. Consent was obtained for management of his left knee infection.  DESCRIPTION OF PROCEDURE: The patient was brought to the operative theater. Once adequate anesthesia, preoperative antibiotics, Ancef  administered, he was positioned supine with a left thigh tourniquet placed. The left lower extremity was then prepped  and draped in sterile fashion. A timeout was performed identifying the patient, planned procedure, and extremity. The leg was exsanguinated and the tourniquet elevated to 225 mmHg. His old incision was excised. Soft tissue planes were created. Medial  arthrotomy was made encountering blood-tinged inflammatory/purulent-appearing fluid. This had a murky-type appearance as opposed to straight purulence. There was no foul odor. Given the fact that we had already had an aspiration sent for Gram stain  culture, no further fluid was sent. At this point, I performed a sharp excisional debridement using a Bovie cautery of the entire surface of the knee including the medial gutter, suprapatellar pouch, and laterally around the patella and lateral gutter.  Following this, debridement including some debridement of soft tissues, muscle, tendon, as  well as minor bone, we removed the old polyethylene  insert. I debrided the posterior aspect of the knee. We then irrigated the knee with 3 liters of normal saline  solution followed by 1 bottle of Surgiphor Betadine -based solution, which I kept in the wound for 4-5 minutes and then washed this out using Prontosan. Once this was done, we changed our gloves. We opened up a new polyethylene insert and placed this into  the knee. We re-irrigated the knee. The tourniquet was let down after 27 minutes. We had prepared Stimulan beads with vancomycin  and tobramycin . Once these were ready, we placed these into the deep capsular tissues posterior, medially, and laterally. At  this point, the knee was brought to about 40 degrees of flexion. We reapproximated the extensor mechanism using #1 Vicryl and #1 Stratafix suture. The remainder of the wound was closed in layers with 2-0 Vicryl and a running Monocryl stitch. The wound  was cleaned, dried, and dressed sterilely using surgical glue and Aquacel dressing. He was then brought to the recovery room in stable condition, tolerating the procedure well.  Postoperatively, we will get a PICC line ordered. We will consult Infectious Disease to follow culture growth and the need for antibiotic selection. We will empirically start him on vancomycin  and ceftriaxone until cultures return.       PAA D: 04/10/2024 6:37:34 pm T: 04/11/2024 12:03:00 am  JOB: [id number]/ [id number]

## 2024-04-12 ENCOUNTER — Other Ambulatory Visit (HOSPITAL_COMMUNITY): Payer: Self-pay

## 2024-04-12 LAB — PROTEIN, BODY FLUID (OTHER): Total Protein, Body Fluid Other: 2.8 g/dL

## 2024-04-12 LAB — CBC
HCT: 39.4 % (ref 39.0–52.0)
Hemoglobin: 12.6 g/dL — ABNORMAL LOW (ref 13.0–17.0)
MCH: 29.6 pg (ref 26.0–34.0)
MCHC: 32 g/dL (ref 30.0–36.0)
MCV: 92.5 fL (ref 80.0–100.0)
Platelets: 266 K/uL (ref 150–400)
RBC: 4.26 MIL/uL (ref 4.22–5.81)
RDW: 13 % (ref 11.5–15.5)
WBC: 16.4 K/uL — ABNORMAL HIGH (ref 4.0–10.5)
nRBC: 0 % (ref 0.0–0.2)

## 2024-04-12 LAB — URIC ACID, BODY FLUID: Uric Acid Body Fluid: 4.3 mg/dL

## 2024-04-12 LAB — CK: Total CK: 910 U/L — ABNORMAL HIGH (ref 49–397)

## 2024-04-12 LAB — GLUCOSE, BODY FLUID OTHER: Glucose, Body Fluid Other: 5 mg/dL

## 2024-04-12 MED ORDER — SODIUM CHLORIDE 0.9 % IV SOLN: INTRAVENOUS | Status: AC

## 2024-04-12 MED ORDER — SENNA 8.6 MG PO TABS: 2.0000 | ORAL_TABLET | Freq: Every day | ORAL | Status: AC

## 2024-04-12 MED ORDER — POLYETHYLENE GLYCOL 3350 17 G PO PACK: 17.0000 g | PACK | Freq: Two times a day (BID) | ORAL | Status: AC

## 2024-04-12 MED ORDER — METHOCARBAMOL 500 MG PO TABS
500.0000 mg | ORAL_TABLET | Freq: Four times a day (QID) | ORAL | 1 refills | Status: AC | PRN
  Filled 2024-04-12: qty 40, 10d supply, fill #0

## 2024-04-12 MED ORDER — ASPIRIN 81 MG PO CHEW: 81.0000 mg | CHEWABLE_TABLET | Freq: Two times a day (BID) | ORAL | Status: AC

## 2024-04-12 MED ORDER — HYDROCODONE-ACETAMINOPHEN 5-325 MG PO TABS
1.0000 | ORAL_TABLET | ORAL | 0 refills | Status: AC | PRN
  Filled 2024-04-12: qty 42, 7d supply, fill #0

## 2024-04-12 NOTE — Plan of Care (Signed)
  Problem: Activity: Goal: Risk for activity intolerance will decrease Outcome: Progressing   Problem: Activity: Goal: Risk for activity intolerance will decrease Outcome: Progressing   

## 2024-04-12 NOTE — Progress Notes (Signed)
 Patient ID: Parker Gould, male   DOB: Jul 29, 1970, 53 y.o.   MRN: [id number]  Parker Gould had been seen by Dr Kay earlier  He is doing fine, no events We reviewed the treatment plan  He was switched to Daptomycin and Cefepime. Gram and culture remain negative which is extremely unfortunate  I would like to plan for discharge to home tomorrow with IV antibiotics with plans to adjust if cultures turn, likely home on Daptomycin.  RTC In 2 weeks

## 2024-04-12 NOTE — Anesthesia Postprocedure Evaluation (Signed)
 Anesthesia Post Note  Patient: Parker Gould  Procedure(s) Performed: IRRIGATION AND DEBRIDEMENT KNEE (Left: Knee)     Patient location during evaluation: PACU Anesthesia Type: General Level of consciousness: awake and alert Pain management: pain level controlled Vital Signs Assessment: post-procedure vital signs reviewed and stable Respiratory status: spontaneous breathing, nonlabored ventilation, respiratory function stable and patient connected to nasal cannula oxygen Cardiovascular status: blood pressure returned to baseline and stable Postop Assessment: no apparent nausea or vomiting Anesthetic complications: no   No notable events documented.  Last Vitals:  Vitals:   04/11/24 2237 04/12/24 0636  BP: 117/66 137/82  Pulse: 61 64  Resp: 17 18  Temp: 36.7 C 36.6 C  SpO2: 96% 98%    Last Pain:  Vitals:   04/12/24 0725  TempSrc:   PainSc: 5                  Lilygrace Rodick P [facility]

## 2024-04-12 NOTE — Progress Notes (Signed)
 Orthopedics Progress Note  Subjective: Patient feeling better. Less pain in the knee today. Walked to 3E  Objective:  Vitals:   04/11/24 2237 04/12/24 0636  BP: 117/66 137/82  Pulse: 61 64  Resp: 17 18  Temp: 98 F (36.7 C) 97.8 F (36.6 C)  SpO2: 96% 98%    General: Awake and alert  Musculoskeletal: Left knee dressing CDI. Minimal swelling and ROM 5-90 Neg Homans Neurovascularly intact  Lab Results  Component Value Date   WBC 16.4 (H) 04/12/2024   HGB 12.6 (L) 04/12/2024   HCT 39.4 04/12/2024   MCV 92.5 04/12/2024   PLT 266 04/12/2024       Component Value Date/Time   NA 135 04/11/2024 0323   K 4.6 04/11/2024 0323   CL 102 04/11/2024 0323   CO2 22 04/11/2024 0323   GLUCOSE 166 (H) 04/11/2024 0323   BUN 11 04/11/2024 0323   CREATININE 1.26 (H) 04/11/2024 0323   CALCIUM  9.5 04/11/2024 0323   GFRNONAA >60 04/11/2024 0323    No results found for: INR, PROTIME  Assessment/Plan: POD #2 s/p Procedure(s): IRRIGATION AND DEBRIDEMENT KNEE Awaiting culture results Continue empiric IV abx Mobilization as tolerated D/C plan to follow  Elspeth SAUNDERS. Kay, MD 04/12/2024 8:12 AM

## 2024-04-12 NOTE — TOC Initial Note (Signed)
 Transition of Care [facility]) - Initial/Assessment Note    Patient Details  Name: Parker Gould MRN: [id number] Date of Birth: 01/26/1971  Transition of Care [facility]) CM/SW Contact:    Sheri ONEIDA Sharps, LCSW Phone Number: 04/12/2024, 6:11 PM  Clinical Narrative:                 Pt from home w/ spouse. Pt continues medical work. Plan to go home w/ IV abx. Pam w/ [facility] following for home IV ABX & will provide HHRN.  Expected Discharge Plan: Home w Home Health Services Barriers to Discharge: Continued Medical Work up   Patient Goals and CMS Choice Patient states their goals for this hospitalization and ongoing recovery are:: return home   Choice offered to / list presented to : NA      Expected Discharge Plan and Services In-house Referral: NA Discharge Planning Services: NA   Living arrangements for the past 2 months: Single Family Home                 DME Arranged: N/A DME Agency: NA       HH Arranged: RN HH Agency: [facility] Date HH Agency Contacted: 04/12/24   Representative spoke with at [facility] Agency: Pam  Prior Living Arrangements/Services Living arrangements for the past 2 months: Single Family Home Lives with:: Spouse Patient language and need for interpreter reviewed:: Yes Do you feel safe going back to the place where you live?: Yes      Need for Family Participation in Patient Care: Yes (Comment) Care giver support system in place?: Yes (comment)   Criminal Activity/Legal Involvement Pertinent to Current Situation/Hospitalization: No - Comment as needed  Activities of Daily Living   ADL Screening (condition at time of admission) Independently performs ADLs?: Yes (appropriate for developmental age) Is the patient deaf or have difficulty hearing?: No Does the patient have difficulty seeing, even when wearing glasses/contacts?: No Does the patient have difficulty concentrating, remembering, or making decisions?: No  Permission Sought/Granted                   Emotional Assessment Appearance:: Appears stated age Attitude/Demeanor/Rapport: Engaged Affect (typically observed): Accepting Orientation: : Oriented to Situation, Oriented to  Time, Oriented to Self, Oriented to Place Alcohol / Substance Use: Not Applicable Psych Involvement: No (comment)  Admission diagnosis:  Effusion of left knee [M25.462] Infection of prosthetic left knee joint [T84.54XA] Syncope, unspecified syncope type [R55] Patient Active Problem List   Diagnosis Date Noted   Infection of prosthetic left knee joint 04/10/2024   PCP:  Pcp, No Pharmacy:   [facility] [address], [place] - [address] [place] [address] Phone: [phone] Fax: [phone]  [id number] [place], [place] - [address] AT [facility] [address] & [facility] [address] [place] [address] Phone: [phone] Fax: [phone]  [place] [facility] [address] [place] [address] Phone: [phone] Fax: [phone]     Social Drivers of Health (SDOH) Social History: SDOH Screenings   Food Insecurity: No Food Insecurity (04/10/2024)  Housing: Low Risk  (04/10/2024)  Transportation Needs: No Transportation Needs (04/10/2024)  Utilities: Not At Risk (04/10/2024)  Tobacco Use: Low Risk  (04/10/2024)   SDOH Interventions:     Readmission Risk Interventions     No data to display

## 2024-04-13 ENCOUNTER — Encounter (HOSPITAL_COMMUNITY): Payer: Self-pay | Admitting: Orthopedic Surgery

## 2024-04-13 ENCOUNTER — Telehealth: Payer: Self-pay | Admitting: Student in an Organized Health Care Education/Training Program

## 2024-04-13 ENCOUNTER — Other Ambulatory Visit (HOSPITAL_COMMUNITY): Payer: Self-pay

## 2024-04-13 DIAGNOSIS — Z96652 Presence of left artificial knee joint: Secondary | ICD-10-CM | POA: Diagnosis not present

## 2024-04-13 DIAGNOSIS — T8454XD Infection and inflammatory reaction due to internal left knee prosthesis, subsequent encounter: Secondary | ICD-10-CM

## 2024-04-13 MED ORDER — CEFTRIAXONE IV (FOR PTA / DISCHARGE USE ONLY): 2.0000 g | INTRAVENOUS | 0 refills | Status: AC

## 2024-04-13 MED ORDER — HEPARIN SOD (PORK) LOCK FLUSH 100 UNIT/ML IV SOLN
250.0000 [IU] | INTRAVENOUS | Status: AC | PRN
  Administered 2024-04-13: 250 [IU]

## 2024-04-13 MED ORDER — DAPTOMYCIN IV (FOR PTA / DISCHARGE USE ONLY): 700.0000 mg | INTRAVENOUS | 0 refills | Status: AC

## 2024-04-13 MED ORDER — SODIUM CHLORIDE 0.9 % IV SOLN
2.0000 g | INTRAVENOUS | Status: DC
  Administered 2024-04-13: 2 g via INTRAVENOUS
  Filled 2024-04-13: qty 20

## 2024-04-13 NOTE — Telephone Encounter (Signed)
 Pt requesting a c/b to discuss his hospital visit.

## 2024-04-13 NOTE — Progress Notes (Signed)
 Patient ID: Parker Gould, male   DOB: [birth year], 53 y.o.   MRN: [id number] Subjective: 3 Days Post-Op Procedure(s) (LRB): IRRIGATION AND DEBRIDEMENT KNEE (Left)    Patient reports pain as mild. No events. Ready to go home  Objective:   VITALS:   Vitals:   04/12/24 2152 04/13/24 0634  BP: 129/79 121/89  Pulse: 61 60  Resp: 18 16  Temp: 98.8 F (37.1 C) 98.1 F (36.7 C)  SpO2: 97% 99%    Neurovascular intact Incision: dressing C/D/I Decreased swelling and tenderness around knee following procedure  LABS Recent Labs    04/10/24 0853 04/11/24 0323 04/12/24 0401  HGB 15.0 13.0 12.6*  HCT 46.1 39.6 39.4  WBC 12.6* 13.9* 16.4*  PLT 284 249 266    Recent Labs    04/10/24 0853 04/11/24 0323  NA 137 135  K 4.0 4.6  BUN 11 11  CREATININE 1.18 1.26*  GLUCOSE 94 166*    No results for input(s): LABPT, INR in the last 72 hours.   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

## 2024-04-13 NOTE — Progress Notes (Signed)
 [facility]  Date of Admission:  04/10/2024      Lines: 9/26-c picc  Abx: Dapto/ceftriaxone  ASSESSMENT: 53 yo male with late acute left prosthetic joint infection  Only an aspirate in ed of sample was sent but no operative sample  We have asked lab to hold for 2 weeks in the rare case it could be p-acnes  Discussed with him plan likely to do a long oral tail even up to 2 years -- reviewed evidence with patient  PLAN: Plan for opat 6 weeks dapto/ceftriaxone. Hopefully culture will grow something that we could beautifully narrow abx to Maintain standard isolation precaution Ok to discharge from id standpoint once cleared by surgery   OPAT Orders Discharge antibiotics to be given via PICC line Discharge antibiotics: Dapto Ceftriaxone  Tentative oral abx deescalation is doxy/augmentin  Duration: 6weeks from 9/26 End Date: 11/07  [facility] Care Per Protocol:  Home health RN for IV administration and teaching; PICC line care and labs.    Labs weekly while on IV antibiotics: _x_ CBC with differential __ BMP _x_ CMP _x_ CRP _x_ ESR __ Vancomycin  trough _x_ CK  _x_ Please pull PIC at completion of IV antibiotics __ Please leave PIC in place until doctor has seen patient or been notified  Fax weekly labs to [phone]  Clinic Follow Up Appt: 10/16 @ 945  @  [facility] [address], [place], [place] [address] Phone: [phone]   Principal Problem:   Infection of prosthetic left knee joint   No Known Allergies  Scheduled Meds:  aspirin   81 mg Oral BID   atorvastatin   20 mg Oral Daily   benazepril   10 mg Oral Daily   Chlorhexidine  Gluconate Cloth  6 each Topical Daily   pantoprazole   40 mg Oral Daily   polyethylene glycol  17 g Oral BID   senna  2 tablet Oral QHS   Continuous Infusions:  cefTRIAXone (ROCEPHIN)  IV Stopped (04/13/24 1201)   DAPTOmycin Stopped (04/13/24 1254)   PRN  Meds:.acetaminophen , alum & mag hydroxide-simeth, bisacodyl , diphenhydrAMINE , HYDROcodone -acetaminophen , HYDROcodone -acetaminophen , HYDROmorphone  (DILAUDID ) injection, menthol  **OR** phenol, methocarbamol  **OR** methocarbamol  (ROBAXIN ) injection, metoCLOPramide  **OR** metoCLOPramide  (REGLAN ) injection, ondansetron  **OR** ondansetron  (ZOFRAN ) IV, sodium chloride  flush   SUBJECTIVE: No complaint Anxious to leave  Review of Systems: ROS All other ROS was negative, except mentioned above     OBJECTIVE: Vitals:   04/12/24 1402 04/12/24 2152 04/13/24 0634 04/13/24 0821  BP: 139/87 129/79 121/89 (!) 143/106  Pulse: (!) 57 61 60 70  Resp:  18 16 17   Temp: 98.3 F (36.8 C) 98.8 F (37.1 C) 98.1 F (36.7 C)   TempSrc: Oral Oral Oral   SpO2: 96% 97% 99%   Weight:      Height:       Body mass index is 25.53 kg/m.  Physical Exam General/constitutional: no distress, pleasant HEENT: Normocephalic, PER, Conj Clear, EOMI, Oropharynx clear Neck supple CV: rrr no mrg Lungs: clear to auscultation, normal respiratory effort Abd: Soft, Nontender Ext: no edema Skin: No Rash Neuro: nonfocal MSK: left knee dressing c/d/i    Lab Results Lab Results  Component Value Date   WBC 16.4 (H) 04/12/2024   HGB 12.6 (L) 04/12/2024   HCT 39.4 04/12/2024   MCV 92.5 04/12/2024   PLT 266 04/12/2024    Lab Results  Component Value Date   CREATININE 1.26 (H) 04/11/2024   BUN 11 04/11/2024  NA 135 04/11/2024   K 4.6 04/11/2024   CL 102 04/11/2024   CO2 22 04/11/2024    Lab Results  Component Value Date   ALT 32 04/10/2024   AST 22 04/10/2024   ALKPHOS 80 04/10/2024   BILITOT 1.0 04/10/2024      Microbiology: Recent Results (from the past 240 hours)  Body fluid culture w Gram Stain     Status: None (Preliminary result)   Collection Time: 04/10/24  8:54 AM   Specimen: Synovium; Body Fluid  Result Value Ref Range Status   Specimen Description   Final    SYNOVIAL Performed at  [facility], [address] [place], [place] [address]    Special Requests   Final    KNEE Performed at [facility], [address], [place], [place] [address]    Gram Stain   Final    MODERATE WBC PRESENT, PREDOMINANTLY PMN NO ORGANISMS SEEN    Culture   Final    NO GROWTH 2 DAYS CONTINUING TO HOLD Performed at [facility], [address], [place], [place] [address]    Report Status PENDING  Incomplete     Serology:   Imaging: If present, new imagings (plain films, ct scans, and mri) have been personally visualized and interpreted; radiology reports have been reviewed. Decision making incorporated into the Impression / Recommendations.   [name], MD [facility] [phone] pager    04/13/2024, 1:04 PM

## 2024-04-13 NOTE — TOC Transition Note (Signed)
 Transition of Care [facility]) - Discharge Note   Patient Details  Name: Parker Gould MRN: [id number] Date of Birth: March 19, 1971  Transition of Care [facility]) CM/SW Contact:  Alfonse JONELLE Rex, RN Phone Number: 04/13/2024, 3:49 PM   Clinical Narrative: DC home with IV Abx and HH RN through [facility]. No further TOC needs identified at this time.       Final next level of care: Home w Home Health Services Barriers to Discharge: Barriers Resolved   Patient Goals and CMS Choice Patient states their goals for this hospitalization and ongoing recovery are:: return home CMS Medicare.gov Compare Post Acute Care list provided to:: Patient Choice offered to / list presented to : Patient [place] ownership interest in [facility].provided to:: Patient    Discharge Placement                       Discharge Plan and Services Additional resources added to the After Visit Summary for   In-house Referral: NA Discharge Planning Services: NA            DME Arranged: N/A DME Agency: NA       HH Arranged: RN HH Agency: [facility] Date HH Agency Contacted: 04/12/24   Representative spoke with at [facility] Agency: Pam  Social Drivers of Health (SDOH) Interventions SDOH Screenings   Food Insecurity: No Food Insecurity (04/10/2024)  Housing: Low Risk  (04/10/2024)  Transportation Needs: No Transportation Needs (04/10/2024)  Utilities: Not At Risk (04/10/2024)  Tobacco Use: Low Risk  (04/10/2024)     Readmission Risk Interventions    04/13/2024    3:48 PM  Readmission Risk Prevention Plan  Post Dischage Appt Complete  Medication Screening Complete  Transportation Screening Complete

## 2024-04-13 NOTE — Progress Notes (Signed)
 PHARMACY CONSULT NOTE FOR:  OUTPATIENT  PARENTERAL ANTIBIOTIC THERAPY (OPAT)  Indication: left knee PJI Regimen: Ceftriaxone 2g IV every 24 hours and Daptomycin 700mg  IV every 24 hours End date: 05/22/24  IV antibiotic discharge orders are pended. To discharging provider:  please sign these orders via discharge navigator,  Select New Orders & click on the button choice - Manage This Unsigned Work.     Thank you for allowing pharmacy to be a part of this patient's care.  Dionicia Canavan, PharmD, RPh PGY1 Acute Care Pharmacy Resident [facility]  04/13/2024 10:48 AM

## 2024-04-13 NOTE — Plan of Care (Signed)
  Problem: Education: Goal: Knowledge of General Education information will improve Description: Including pain rating scale, medication(s)/side effects and non-pharmacologic comfort measures Outcome: Progressing   Problem: Clinical Measurements: Goal: Ability to maintain clinical measurements within normal limits will improve Outcome: Progressing   Problem: Activity: Goal: Risk for activity intolerance will decrease Outcome: Progressing   Problem: Elimination: Goal: Will not experience complications related to bowel motility Outcome: Progressing   Problem: Pain Managment: Goal: General experience of comfort will improve and/or be controlled Outcome: Progressing   Problem: Safety: Goal: Ability to remain free from injury will improve Outcome: Progressing   Problem: Skin Integrity: Goal: Risk for impaired skin integrity will decrease Outcome: Progressing

## 2024-04-13 NOTE — Progress Notes (Signed)
 Discharge meds in a secure bag delivered to pt in room by this RN

## 2024-04-13 NOTE — Progress Notes (Incomplete)
 PHARMACY CONSULT NOTE FOR:  OUTPATIENT  PARENTERAL ANTIBIOTIC THERAPY (OPAT)  Indication:  Regimen:  End date:   IV antibiotic discharge orders are pended. To discharging provider:  please sign these orders via discharge navigator,  Select New Orders & click on the button choice - Manage This Unsigned Work.     Thank you for allowing pharmacy to be a part of this patient's care.  Parker Gould 04/13/2024, 9:53 AM

## 2024-04-13 NOTE — Progress Notes (Signed)
 Provided with discharge education/instructions. Pt is not in any distress, medication delivered to room, PICC capped for home use by IV team. Pt discharged home with all of his belongings accompanied by wife.

## 2024-04-13 NOTE — Telephone Encounter (Signed)
 Called patient left message on personal voice mail to call back.

## 2024-04-16 ENCOUNTER — Telehealth: Payer: Self-pay | Admitting: Student in an Organized Health Care Education/Training Program

## 2024-04-16 ENCOUNTER — Telehealth (HOSPITAL_COMMUNITY): Payer: Self-pay | Admitting: Student in an Organized Health Care Education/Training Program

## 2024-04-16 NOTE — Telephone Encounter (Signed)
 Will send to ordering provider.

## 2024-04-16 NOTE — Telephone Encounter (Signed)
 Patient called to report he was hospitalized for an infection in his knee and had his echo test done while he was in hospital.  Patient noted he canceled his stress test as he is unable to do that test.

## 2024-04-16 NOTE — Telephone Encounter (Signed)
 Patient cancelled echocardiogram due to he has already had echo and cancelled GXT due to infection in knee. Order will be removed from the echo WQ. If patient calls back to reschedule GXT we will reinstate the order. Thank you.

## 2024-04-16 NOTE — Telephone Encounter (Signed)
 Will let ordering provider know.

## 2024-04-20 NOTE — Telephone Encounter (Signed)
 Left message for pt to call.

## 2024-04-20 NOTE — Telephone Encounter (Signed)
 Spoke with pt, he wanted to let dr floretta know his echo was done while in the hospital and wanted him to take a look at the report. He also wanted to let dr floretta know his knee replacement got infected and he will not be able to do any kind of walking stress test. He also wonders if the infection may have contributed. Aware will forward to dr sullivan.

## 2024-04-20 NOTE — Telephone Encounter (Signed)
 Patient is returning call.

## 2024-04-21 NOTE — Discharge Summary (Signed)
 Patient ID: Parker Gould MRN: [id number] DOB/AGE: [birth year] 53 y.o.  Admit date: 04/10/2024 Discharge date: 04/13/2024  Admission Diagnoses:  Left knee prosthetic joint infection   Discharge Diagnoses:  Principal Problem:   Infection of prosthetic left knee joint   Past Medical History:  Diagnosis Date   Arthritis    LEFT knee   GERD (gastroesophageal reflux disease)    PRN OTC meds   Hyperlipidemia    on meds   Hypertension    on meds    Surgeries: Procedure(s): IRRIGATION AND DEBRIDEMENT KNEE on 04/10/2024   Consultants: Treatment Team:  Ernie Cough, MD Kay Kemps, MD  Discharged Condition: Improved  Hospital Course: QUINNLAN ABRUZZO is an 53 y.o. male who was admitted 04/10/2024 for operative treatment ofInfection of prosthetic left knee joint. After pre-op clearance the patient was taken to the operating room on 04/10/2024 and underwent  Procedure(s): IRRIGATION AND DEBRIDEMENT KNEE.    Patient was given perioperative antibiotics:  Anti-infectives (From admission, onward)    Start     Dose/Rate Route Frequency Ordered Stop   04/13/24 1200  cefTRIAXone (ROCEPHIN) 2 g in sodium chloride  0.9 % 100 mL IVPB  Status:  Discontinued        2 g 200 mL/hr over 30 Minutes Intravenous Every 24 hours 04/13/24 0936 04/13/24 2023   04/13/24 0000  cefTRIAXone (ROCEPHIN) IVPB        2 g Intravenous Every 24 hours 04/13/24 1320 05/22/24 2359   04/13/24 0000  daptomycin (CUBICIN) IVPB        700 mg Intravenous Every 24 hours 04/13/24 1320 05/22/24 2359   04/11/24 2000  DAPTOmycin (CUBICIN) IVPB 700 mg/192mL premix  Status:  Discontinued        700 mg 200 mL/hr over 30 Minutes Intravenous Daily 04/11/24 1058 04/13/24 2023   04/11/24 1700  cefTRIAXone (ROCEPHIN) 2 g in sodium chloride  0.9 % 100 mL IVPB  Status:  Discontinued        2 g 200 mL/hr over 30 Minutes Intravenous Every 24 hours 04/10/24 1857 04/11/24 1047   04/11/24 1200  ceFEPIme (MAXIPIME) 2 g in sodium chloride   0.9 % 100 mL IVPB  Status:  Discontinued        2 g 200 mL/hr over 30 Minutes Intravenous Every 8 hours 04/11/24 1052 04/13/24 0936   04/11/24 0800  vancomycin  (VANCOCIN ) IVPB 1000 mg/200 mL premix  Status:  Discontinued        1,000 mg 200 mL/hr over 60 Minutes Intravenous Every 12 hours 04/10/24 1910 04/11/24 1047   04/11/24 0700  vancomycin  (VANCOCIN ) IVPB 1000 mg/200 mL premix  Status:  Discontinued        1,000 mg 200 mL/hr over 60 Minutes Intravenous Every 12 hours 04/10/24 1908 04/10/24 1910   04/10/24 1930  vancomycin  (VANCOREADY) IVPB 1750 mg/350 mL        1,750 mg 175 mL/hr over 120 Minutes Intravenous  Once 04/10/24 1908 04/10/24 2200   04/10/24 1747  vancomycin  (VANCOCIN ) powder  Status:  Discontinued          As needed 04/10/24 1747 04/10/24 1843   04/10/24 1747  tobramycin  (NEBCIN ) powder  Status:  Discontinued          As needed 04/10/24 1747 04/10/24 1843   04/10/24 1515  ceFAZolin  (ANCEF ) IVPB 2g/100 mL premix        2 g 200 mL/hr over 30 Minutes Intravenous On call to O.R. 04/10/24 1506 04/10/24 1721  Patient was given sequential compression devices, early ambulation, and chemoprophylaxis to prevent DVT. Patient worked with PT and was meeting their goals regarding safe ambulation and transfers. He had a PICC line placed. Infectious disease was consulted and placed him on dapto/ceftriaxone x6 weeks.   Patient benefited maximally from hospital stay and there were no complications.    Recent vital signs: No data found.   Recent laboratory studies: No results for input(s): WBC, HGB, HCT, PLT, NA, K, CL, CO2, BUN, CREATININE, GLUCOSE, INR, CALCIUM  in the last 72 hours.  Invalid input(s): PT, 2   Discharge Medications:   Allergies as of 04/13/2024   No Known Allergies      Medication List     PAUSE taking these medications    atorvastatin  20 MG tablet Wait to take this until: May 23, 2024 Commonly known as:  LIPITOR Take 20 mg by mouth daily.       TAKE these medications    aspirin  81 MG chewable tablet Chew 1 tablet (81 mg total) by mouth 2 (two) times daily for 28 days.   benazepril  10 MG tablet Commonly known as: LOTENSIN  Take 10 mg by mouth daily.   cefTRIAXone IVPB Commonly known as: ROCEPHIN Inject 2 g into the vein daily. Indication:  Left knee PJI First Dose: Yes Last Day of Therapy:  05/22/24 Labs - Once weekly:  CBC/D and BMP, Labs - Once weekly: ESR and CRP Method of administration: IV Push Method of administration may be changed at the discretion of home infusion pharmacist based upon assessment of the patient and/or caregiver's ability to self-administer the medication ordered. Notes to patient: Last dose given 09/29 11:31am   daptomycin IVPB Commonly known as: CUBICIN Inject 700 mg into the vein daily. Indication:  Left knee PJI First Dose: Yes Last Day of Therapy:  05/22/24 Labs - Once weekly:  CBC/D, BMP, and CPK Labs - Once weekly: ESR and CRP Method of administration: IV Push Method of administration may be changed at the discretion of home infusion pharmacist based upon assessment of the patient and/or caregiver's ability to self-administer the medication ordered. Notes to patient: Last dose given 09/29 12:24pm   esomeprazole 20 MG capsule Commonly known as: NEXIUM Take 20 mg by mouth daily as needed (acid reflux). Notes to patient: Resume home regimen   HYDROcodone -acetaminophen  5-325 MG tablet Commonly known as: NORCO/VICODIN Take 1 tablet by mouth every 4 (four) hours as needed for severe pain (pain score 7-10). Notes to patient: Last dose given 09/29 01:22pm   methocarbamol  500 MG tablet Commonly known as: ROBAXIN  Take 1 tablet (500 mg total) by mouth every 6 (six) hours as needed for muscle spasms. Notes to patient: Last dose given 09/29 08:20am   polyethylene glycol 17 g packet Commonly known as: MIRALAX  / GLYCOLAX  Take 17 g by mouth 2 (two)  times daily.   senna 8.6 MG Tabs tablet Commonly known as: SENOKOT Take 2 tablets (17.2 mg total) by mouth at bedtime.               Discharge Care Instructions  (From admission, onward)           Start     Ordered   04/13/24 0000  Change dressing       Comments: Maintain surgical dressing until follow up in the clinic. If the edges start to pull up, may reinforce with tape. If the dressing is no longer working, may remove and cover with gauze and tape, but must keep  the area dry and clean.  Call with any questions or concerns.   04/13/24 1044   04/13/24 0000  Change dressing on IV access line weekly and PRN  (Home infusion instructions - [facility] )        04/13/24 1320   04/13/24 0000  Change dressing       Comments: Maintain surgical dressing until follow up in the clinic. If the edges start to pull up, may reinforce with tape. If the dressing is no longer working, may remove and cover with gauze and tape, but must keep the area dry and clean.  Call with any questions or concerns.   04/13/24 1320            Diagnostic Studies: ECHOCARDIOGRAM COMPLETE Result Date: 04/11/2024    ECHOCARDIOGRAM REPORT   Patient Name:   BRYLER DIBBLE Date of Exam: 04/11/2024 Medical Rec #:  [id number]      Height:       70.0 in Accession #:    [id number]     Weight:       177.9 lb Date of Birth:  11-25-1970     BSA:          1.986 m Patient Age:    52 years       BP:           134/75 mmHg Patient Gender: M              HR:           59 bpm. Exam Location:  Inpatient Procedure: 2D Echo, Color Doppler and Cardiac Doppler (Both Spectral and Color            Flow Doppler were utilized during procedure). Indications:    R55 Syncope  History:        Patient has no prior history of Echocardiogram examinations.                 Risk Factors:Hypertension and Dyslipidemia.  Sonographer:    Damien Senior RDCS Referring Phys: [id number] JON KNAPP IMPRESSIONS  1. Left ventricular ejection fraction, by  estimation, is 60 to 65%. The left ventricle has normal function. The left ventricle has no regional wall motion abnormalities. Left ventricular diastolic parameters were normal.  2. Right ventricular systolic function is normal. The right ventricular size is mildly enlarged. Tricuspid regurgitation signal is inadequate for assessing PA pressure.  3. The mitral valve is normal in structure. No evidence of mitral valve regurgitation. No evidence of mitral stenosis.  4. The aortic valve is normal in structure. Aortic valve regurgitation is trivial. No aortic stenosis is present.  5. Aortic dilatation noted. There is mild dilatation of the aortic root, measuring 40 mm.  6. The inferior vena cava is normal in size with <50% respiratory variability, suggesting right atrial pressure of 8 mmHg. FINDINGS  Left Ventricle: Left ventricular ejection fraction, by estimation, is 60 to 65%. The left ventricle has normal function. The left ventricle has no regional wall motion abnormalities. The left ventricular internal cavity size was normal in size. There is  no left ventricular hypertrophy. Left ventricular diastolic parameters were normal. Normal left ventricular filling pressure. Right Ventricle: The right ventricular size is mildly enlarged. No increase in right ventricular wall thickness. Right ventricular systolic function is normal. Tricuspid regurgitation signal is inadequate for assessing PA pressure. Left Atrium: Left atrial size was normal in size. Right Atrium: Right atrial size was normal in size. Pericardium: There is no  evidence of pericardial effusion. Mitral Valve: The mitral valve is normal in structure. No evidence of mitral valve regurgitation. No evidence of mitral valve stenosis. Tricuspid Valve: The tricuspid valve is normal in structure. Tricuspid valve regurgitation is trivial. No evidence of tricuspid stenosis. Aortic Valve: The aortic valve is normal in structure. Aortic valve regurgitation is  trivial. No aortic stenosis is present. Pulmonic Valve: The pulmonic valve was normal in structure. Pulmonic valve regurgitation is not visualized. No evidence of pulmonic stenosis. Aorta: Aortic dilatation noted. There is mild dilatation of the aortic root, measuring 40 mm. Venous: The inferior vena cava is normal in size with less than 50% respiratory variability, suggesting right atrial pressure of 8 mmHg. IAS/Shunts: No atrial level shunt detected by color flow Doppler.  LEFT VENTRICLE PLAX 2D LVIDd:         5.30 cm   Diastology LVIDs:         3.20 cm   LV e' medial:    10.60 cm/s LV PW:         1.00 cm   LV E/e' medial:  7.1 LV IVS:        1.00 cm   LV e' lateral:   14.70 cm/s LVOT diam:     2.20 cm   LV E/e' lateral: 5.1 LV SV:         73 LV SV Index:   37 LVOT Area:     3.80 cm LV IVRT:       106 msec  RIGHT VENTRICLE RV S prime:     16.30 cm/s TAPSE (M-mode): 2.7 cm LEFT ATRIUM             Index        RIGHT ATRIUM           Index LA diam:        3.30 cm 1.66 cm/m   RA Area:     15.70 cm LA Vol (A2C):   45.8 ml 23.06 ml/m  RA Volume:   38.70 ml  19.49 ml/m LA Vol (A4C):   33.7 ml 16.97 ml/m LA Biplane Vol: 39.3 ml 19.79 ml/m  AORTIC VALVE LVOT Vmax:   103.00 cm/s LVOT Vmean:  63.600 cm/s LVOT VTI:    0.193 m  AORTA Ao Root diam: 4.05 cm Ao Asc diam:  3.10 cm MITRAL VALVE MV Area (PHT): 2.94 cm    SHUNTS MV Decel Time: 258 msec    Systemic VTI:  0.19 m MV E velocity: 75.40 cm/s  Systemic Diam: 2.20 cm MV A velocity: 62.10 cm/s MV E/A ratio:  1.21 Wilbert Bihari MD Electronically signed by Wilbert Bihari MD Signature Date/Time: 04/11/2024/12:07:31 PM    Final    US  EKG SITE RITE Result Date: 04/10/2024 If Site Rite image not attached, placement could not be confirmed due to current cardiac rhythm.  US  EKG SITE RITE Result Date: 04/10/2024 If Site Rite image not attached, placement could not be confirmed due to current cardiac rhythm.  DG Knee 2 Views Left Result Date: 04/10/2024 CLINICAL DATA:   Posttraumatic knee pain. EXAM: LEFT KNEE - 1-2 VIEW COMPARISON:  None Available. FINDINGS: Status post total knee arthroplasty. The hardware appears intact, without loosening. No evidence of acute fracture or dislocation. Possible small intra-articular loose bodies versus hydroxyapatite deposition. Small knee joint effusion. IMPRESSION: No evidence of acute fracture or dislocation. Small knee joint effusion. Electronically Signed   By: Elsie Perone M.D.   On: 04/10/2024 09:53   DG Chest  Portable 1 View Result Date: 04/10/2024 CLINICAL DATA:  Syncopal episode.  Dizziness. EXAM: PORTABLE CHEST 1 VIEW COMPARISON:  Radiographs 11/09/2011. FINDINGS: 0910 hours. Two views submitted. The heart size and mediastinal contours are normal. The lungs are clear. There is no pleural effusion or pneumothorax. No acute osseous findings are identified. Telemetry leads overlie the chest. IMPRESSION: No active cardiopulmonary process. Electronically Signed   By: Elsie Perone M.D.   On: 04/10/2024 09:52    Disposition: Discharge disposition: 01-Home or Self Care       Discharge Instructions     [facility] pharmacist to adjust dose for Vancomycin , Aminoglycosides and other anti-infective therapies as requested by physician.   Complete by: As directed    [facility] to provide Cath Flo 2mg    Complete by: As directed    Administer for PICC line occlusion and as ordered by physician for other access device issues.   Anaphylaxis Kit: Provided to treat any anaphylactic reaction to the medication being provided to the patient if First Dose or when requested by physician   Complete by: As directed    Epinephrine  1mg /ml vial / amp: Administer 0.3mg  (0.16ml) subcutaneously once for moderate to severe anaphylaxis, nurse to call physician and pharmacy when reaction occurs and call 911 if needed for immediate care   Diphenhydramine  50mg /ml IV vial: Administer 25-50mg  IV/IM PRN for first dose  reaction, rash, itching, mild reaction, nurse to call physician and pharmacy when reaction occurs   Sodium Chloride  0.9% NS 500ml IV: Administer if needed for hypovolemic blood pressure drop or as ordered by physician after call to physician with anaphylactic reaction   Call MD / Call 911   Complete by: As directed    If you experience chest pain or shortness of breath, CALL 911 and be transported to the hospital emergency room.  If you develope a fever above 101 F, pus (white drainage) or increased drainage or redness at the wound, or calf pain, call your surgeon's office.   Call MD / Call 911   Complete by: As directed    If you experience chest pain or shortness of breath, CALL 911 and be transported to the hospital emergency room.  If you develope a fever above 101 F, pus (white drainage) or increased drainage or redness at the wound, or calf pain, call your surgeon's office.   Change dressing   Complete by: As directed    Maintain surgical dressing until follow up in the clinic. If the edges start to pull up, may reinforce with tape. If the dressing is no longer working, may remove and cover with gauze and tape, but must keep the area dry and clean.  Call with any questions or concerns.   Change dressing   Complete by: As directed    Maintain surgical dressing until follow up in the clinic. If the edges start to pull up, may reinforce with tape. If the dressing is no longer working, may remove and cover with gauze and tape, but must keep the area dry and clean.  Call with any questions or concerns.   Change dressing on IV access line weekly and PRN   Complete by: As directed    Constipation Prevention   Complete by: As directed    Drink plenty of fluids.  Prune juice may be helpful.  You may use a stool softener, such as Colace (over the counter) 100 mg twice a day.  Use MiraLax  (over the counter) for constipation as needed.  Constipation Prevention   Complete by: As directed    Drink  plenty of fluids.  Prune juice may be helpful.  You may use a stool softener, such as Colace (over the counter) 100 mg twice a day.  Use MiraLax  (over the counter) for constipation as needed.   Diet - low sodium heart healthy   Complete by: As directed    Diet - low sodium heart healthy   Complete by: As directed    Flush IV access with Sodium Chloride  0.9% and Heparin 10 units/ml or 100 units/ml   Complete by: As directed    Home infusion instructions - [facility]   Complete by: As directed    Instructions: Flush IV access with Sodium Chloride  0.9% and Heparin 10units/ml or 100units/ml   Change dressing on IV access line: Weekly and PRN   Instructions Cath Flo 2mg : Administer for PICC Line occlusion and as ordered by physician for other access device   [facility] pharmacist to adjust dose for: Vancomycin , Aminoglycosides and other anti-infective therapies as requested by physician   Increase activity slowly as tolerated   Complete by: As directed    Weight bearing as tolerated with assist device (walker, cane, etc) as directed, use it as long as suggested by your surgeon or therapist, typically at least 4-6 weeks.   Increase activity slowly as tolerated   Complete by: As directed    Weight bearing as tolerated with assist device (walker, cane, etc) as directed, use it as long as suggested by your surgeon or therapist, typically at least 4-6 weeks.   Method of administration may be changed at the discretion of home infusion pharmacist based upon assessment of the patient and/or caregiver's ability to self-administer the medication ordered   Complete by: As directed    Post-operative opioid taper instructions:   Complete by: As directed    POST-OPERATIVE OPIOID TAPER INSTRUCTIONS: It is important to wean off of your opioid medication as soon as possible. If you do not need pain medication after your surgery it is ok to stop day one. Opioids include: Codeine,  Hydrocodone (Norco, Vicodin), Oxycodone (Percocet, oxycontin ) and hydromorphone  amongst others.  Long term and even short term use of opiods can cause: Increased pain response Dependence Constipation Depression Respiratory depression And more.  Withdrawal symptoms can include Flu like symptoms Nausea, vomiting And more Techniques to manage these symptoms Hydrate well Eat regular healthy meals Stay active Use relaxation techniques(deep breathing, meditating, yoga) Do Not substitute Alcohol to help with tapering If you have been on opioids for less than two weeks and do not have pain than it is ok to stop all together.  Plan to wean off of opioids This plan should start within one week post op of your joint replacement. Maintain the same interval or time between taking each dose and first decrease the dose.  Cut the total daily intake of opioids by one tablet each day Next start to increase the time between doses. The last dose that should be eliminated is the evening dose.      Post-operative opioid taper instructions:   Complete by: As directed    POST-OPERATIVE OPIOID TAPER INSTRUCTIONS: It is important to wean off of your opioid medication as soon as possible. If you do not need pain medication after your surgery it is ok to stop day one. Opioids include: Codeine, Hydrocodone (Norco, Vicodin), Oxycodone (Percocet, oxycontin ) and hydromorphone  amongst others.  Long term and even short term use of opiods can cause: Increased  pain response Dependence Constipation Depression Respiratory depression And more.  Withdrawal symptoms can include Flu like symptoms Nausea, vomiting And more Techniques to manage these symptoms Hydrate well Eat regular healthy meals Stay active Use relaxation techniques(deep breathing, meditating, yoga) Do Not substitute Alcohol to help with tapering If you have been on opioids for less than two weeks and do not have pain than it is ok to stop all  together.  Plan to wean off of opioids This plan should start within one week post op of your joint replacement. Maintain the same interval or time between taking each dose and first decrease the dose.  Cut the total daily intake of opioids by one tablet each day Next start to increase the time between doses. The last dose that should be eliminated is the evening dose.      TED hose   Complete by: As directed    Use stockings (TED hose) for 2 weeks on both leg(s).  You may remove them at night for sleeping.   TED hose   Complete by: As directed    Use stockings (TED hose) for 2 weeks on both leg(s).  You may remove them at night for sleeping.        Follow-up Information     Ernie Cough, MD. Schedule an appointment as soon as possible for a visit in 2 week(s).   Specialty: Orthopedic Surgery Contact information: [address] [place] [address] [phone]                  Signed: Rosina JONELLE Calin 04/21/2024, 7:08 AM

## 2024-04-24 LAB — BODY FLUID CULTURE W GRAM STAIN: Culture: NO GROWTH

## 2024-04-30 ENCOUNTER — Encounter: Payer: Self-pay | Admitting: Internal Medicine

## 2024-04-30 ENCOUNTER — Ambulatory Visit: Payer: Self-pay | Admitting: Internal Medicine

## 2024-04-30 ENCOUNTER — Other Ambulatory Visit: Payer: Self-pay

## 2024-04-30 VITALS — BP 144/90 | HR 68 | Temp 97.8°F | Resp 16 | Wt 178.0 lb

## 2024-04-30 DIAGNOSIS — T8454XD Infection and inflammatory reaction due to internal left knee prosthesis, subsequent encounter: Secondary | ICD-10-CM | POA: Diagnosis not present

## 2024-04-30 MED ORDER — DOXYCYCLINE HYCLATE 100 MG PO TABS
100.0000 mg | ORAL_TABLET | Freq: Two times a day (BID) | ORAL | 11 refills | Status: AC
Start: 2024-04-30 — End: 2025-04-25

## 2024-04-30 MED ORDER — CEFADROXIL 500 MG PO CAPS: 1000.0000 mg | ORAL_CAPSULE | Freq: Two times a day (BID) | ORAL | 11 refills | Status: AC

## 2024-04-30 NOTE — Patient Instructions (Signed)
 On 11/07 picc to be removed   Start doxycycline 100 mg bid and cefadroxil 1000 mg bid on 11/08 For doxycycline; avoid dairy/cations (calcium , iron, magnesium) within 2 hrs taking. Take full glass of water and wear sun screen and stay upright for half an hour after taking  See me in 6 weeks

## 2024-04-30 NOTE — Progress Notes (Signed)
 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   MRN: [id number]  Chief Complaint  Patient presents with   Hospitalization Follow-up    Pji - left knee     HPI:  Parker Gould is a 53 y.o. male here for f/u left pji infection   Patient had I&D and poly exchange on 9/27  No operative cx 9/26 aspirate cx negative after 14 days  On dapto/ceftriaxone No complaint today Back at work   No Known Allergies    Outpatient Medications Prior to Visit  Medication Sig Dispense Refill   aspirin  81 MG chewable tablet Chew 1 tablet (81 mg total) by mouth 2 (two) times daily for 28 days.     atorvastatin  (LIPITOR) 20 MG tablet Take 20 mg by mouth daily.     benazepril  (LOTENSIN ) 10 MG tablet Take 10 mg by mouth daily.     cefTRIAXone (ROCEPHIN) IVPB Inject 2 g into the vein daily. Indication:  Left knee PJI First Dose: Yes Last Day of Therapy:  05/22/24 Labs - Once weekly:  CBC/D and BMP, Labs - Once weekly: ESR and CRP Method of administration: IV Push Method of administration may be changed at the discretion of home infusion pharmacist based upon assessment of the patient and/or caregiver's ability to self-administer the medication ordered. 39 Units 0   daptomycin (CUBICIN) IVPB Inject 700 mg into the vein daily. Indication:  Left knee PJI First Dose: Yes Last Day of Therapy:  05/22/24 Labs - Once weekly:  CBC/D, BMP, and CPK Labs - Once weekly: ESR and CRP Method of administration: IV Push Method of administration may be changed at the discretion of home infusion pharmacist based upon assessment of the patient and/or caregiver's ability to self-administer the medication ordered. 39 Units 0   esomeprazole (NEXIUM) 20 MG capsule Take 20 mg by mouth daily as needed (acid reflux).      HYDROcodone -acetaminophen  (NORCO/VICODIN) 5-325 MG tablet Take 1 tablet by mouth every 4 (four) hours as needed for severe pain (pain score 7-10). 42 tablet 0   methocarbamol  (ROBAXIN ) 500 MG tablet Take 1 tablet (500 mg total) by mouth every 6 (six) hours as needed for muscle spasms. 40 tablet 1   polyethylene glycol (MIRALAX  / GLYCOLAX ) 17 g packet Take 17 g by mouth 2 (two) times daily.     senna (SENOKOT) 8.6 MG TABS tablet Take 2 tablets (17.2 mg total) by mouth at bedtime.     No facility-administered medications prior to visit.     Social History   Socioeconomic History   Marital status: Married    Spouse name: Not on file   Number of children: Not on file   Years of education: Not on file   Highest education level: Not on file  Occupational History   Not on file  Tobacco Use   Smoking status: Never   Smokeless tobacco: Never  Vaping Use   Vaping status: Never Used  Substance and Sexual Activity   Alcohol use: Not Currently   Drug use: Never   Sexual activity: Not on file  Other Topics Concern   Not on file  Social History Narrative   Not on file   Social Drivers of Corporate investment banker  Strain: Not on file  Food Insecurity: No Food Insecurity (04/10/2024)   Hunger Vital Sign    Worried About Running Out of Food in the Last Year: Never true    Ran Out of Food in the Last Year: Never true  Transportation Needs: No Transportation Needs (04/10/2024)   PRAPARE - Administrator, Civil Service (Medical): No    Lack of Transportation (Non-Medical): No  Physical Activity: Not on file  Stress: Not on file  Social Connections: Not on file  Intimate Partner Violence: Not At Risk (04/10/2024)   Humiliation, Afraid, Rape, and Kick questionnaire    Fear of Current or Ex-Partner: No    Emotionally Abused: No    Physically Abused: No    Sexually Abused: No      Review of Systems    All other ros negative Objective:    BP (!) 144/90   Pulse 68    Temp 97.8 F (36.6 C) (Oral)   Resp 16   Wt 178 lb (80.7 kg)   SpO2 98%   BMI 25.54 kg/m  Nursing note and vital signs reviewed.  Physical Exam     General/constitutional: no distress, pleasant HEENT: Normocephalic, PER, Conj Clear, EOMI, Oropharynx clear Neck supple CV: rrr no mrg Lungs: clear to auscultation, normal respiratory effort Abd: Soft, Nontender Ext: no edema Skin/msk: no incision dehiscence; mild swelling; mild tenderness   Line: rue picc site no erythema/tenderness   Labs:  Micro:  Serology:  Imaging:  Assessment & Plan:   Problem List Items Addressed This Visit     Infection of prosthetic left knee joint - Primary   Relevant Medications   cefadroxil (DURICEF) 500 MG capsule      No orders of the defined types were placed in this encounter.    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 After 6 weeks iv will do oral abx with doxycycline/cefadroxil to cover for most common organisms that could affect the knee joint   EOT for dapto/ceftriaxone in 11/07 Picc order removal placed on opat note previously Rx doxy/cefadroxil to pick up and start 11/8  F/u with me about 2 weeks into  oral abx which is near end of 05/2024    Follow-up: Return in about 6 weeks (around 06/11/2024).      [name], MD [facility] 04/30/2024, 9:58 AM

## 2024-05-01 ENCOUNTER — Telehealth: Payer: Self-pay

## 2024-05-01 NOTE — Telephone Encounter (Signed)
 Message sent to [facility] - per Dr.Vu, stop IV abx and pull PICC line on 05/21/2024.  Praneel Haisley SHAUNNA Letters, CMA

## 2024-05-20 ENCOUNTER — Encounter (HOSPITAL_COMMUNITY): Payer: Self-pay

## 2024-05-20 ENCOUNTER — Other Ambulatory Visit (HOSPITAL_COMMUNITY): Payer: Self-pay

## 2024-06-16 ENCOUNTER — Encounter: Payer: Self-pay | Admitting: Internal Medicine

## 2024-06-16 ENCOUNTER — Ambulatory Visit: Admitting: Internal Medicine

## 2024-06-16 ENCOUNTER — Other Ambulatory Visit: Payer: Self-pay

## 2024-06-16 VITALS — BP 124/83 | HR 62 | Temp 97.6°F | Ht 70.0 in | Wt 179.0 lb

## 2024-06-16 DIAGNOSIS — T8450XD Infection and inflammatory reaction due to unspecified internal joint prosthesis, subsequent encounter: Secondary | ICD-10-CM

## 2024-06-16 NOTE — Progress Notes (Signed)
 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   MRN: [id number]  Chief Complaint  Patient presents with   Follow-up    HPI:  GAMAL TODISCO is a 53 y.o. male here for f/u left pji infection   Patient had I&D and poly exchange on 9/27  No operative cx 9/26 aspirate cx negative after 14 days  On dapto/ceftriaxone  No complaint today Back at work   06/16/24 id clinic f/u Doing well Some nausea with abx Yellow staining of his teeth removeable by dentist routine cleaning. Asking if abx could do it Knee just a twinge of discomfort here and there No rash     No Known Allergies    Outpatient Medications Prior to Visit  Medication Sig Dispense Refill   atorvastatin  (LIPITOR) 20 MG tablet Take 20 mg by mouth daily.     benazepril  (LOTENSIN ) 10 MG tablet Take 10 mg by mouth daily.     cefadroxil  (DURICEF) 500 MG capsule Take 2 capsules (1,000 mg total) by mouth 2 (two) times daily. 120 capsule 11   doxycycline  (VIBRA -TABS) 100 MG tablet Take 1 tablet (100 mg total) by mouth 2 (two) times daily. 60 tablet 11   esomeprazole (NEXIUM) 20 MG capsule Take 20 mg by mouth daily as needed (acid reflux).     HYDROcodone -acetaminophen  (NORCO/VICODIN) 5-325 MG tablet Take 1 tablet by mouth every 4 (four) hours as needed for severe pain (pain score 7-10). 42 tablet 0   methocarbamol  (ROBAXIN ) 500 MG tablet Take 1 tablet (500 mg total) by mouth every 6 (six) hours as needed for muscle spasms. 40 tablet 1   polyethylene glycol (MIRALAX  / GLYCOLAX ) 17 g packet Take 17 g by mouth 2 (two) times daily.     senna (SENOKOT) 8.6 MG TABS tablet Take 2 tablets (17.2 mg total) by mouth at bedtime.     No facility-administered medications prior to visit.     Social History   Socioeconomic History    Marital status: Married    Spouse name: Not on file   Number of children: Not on file   Years of education: Not on file   Highest education level: Not on file  Occupational History   Not on file  Tobacco Use   Smoking status: Never   Smokeless tobacco: Never  Vaping Use   Vaping status: Never Used  Substance and Sexual Activity   Alcohol use: Not Currently   Drug use: Never   Sexual activity: Not on file  Other Topics Concern   Not on file  Social History Narrative   Not on file   Social Drivers of Health   Financial Resource Strain: Not on file  Food Insecurity: No Food Insecurity (04/10/2024)   Hunger Vital Sign    Worried About Running Out of Food in the Last Year: Never true    Ran Out of Food in the Last Year: Never true  Transportation Needs: No Transportation Needs (04/10/2024)   PRAPARE - Administrator, Civil Service (Medical): No    Lack of Transportation (Non-Medical): No  Physical Activity: Not on file  Stress: Not on file  Social Connections: Not on file  Intimate Partner Violence: Not At Risk (04/10/2024)   Humiliation, Afraid,  Rape, and Kick questionnaire    Fear of Current or Ex-Partner: No    Emotionally Abused: No    Physically Abused: No    Sexually Abused: No      Review of Systems    All other ros negative Objective:    BP 124/83   Pulse 62   Temp 97.6 F (36.4 C) (Oral)   Ht 5' 10 (1.778 m)   Wt 179 lb (81.2 kg)   SpO2 98%   BMI 25.68 kg/m  Nursing note and vital signs reviewed.  Physical Exam     General/constitutional: no distress, pleasant HEENT: Normocephalic, PER, Conj Clear, EOMI, Oropharynx clear Neck supple CV: rrr no mrg Lungs: clear to auscultation, normal respiratory effort Abd: Soft, Nontender Ext: no edema Skin/msk: no swelling or tenderness bilateral knee; full rom knees     Labs: Lab Results  Component Value Date   WBC 16.4 (H) 04/12/2024   HGB 12.6 (L) 04/12/2024   HCT 39.4 04/12/2024    MCV 92.5 04/12/2024   PLT 266 04/12/2024   Last metabolic panel Lab Results  Component Value Date   GLUCOSE 166 (H) 04/11/2024   NA 135 04/11/2024   K 4.6 04/11/2024   CL 102 04/11/2024   CO2 22 04/11/2024   BUN 11 04/11/2024   CREATININE 1.26 (H) 04/11/2024   GFRNONAA >60 04/11/2024   CALCIUM  9.5 04/11/2024   PROT 7.2 04/10/2024   ALBUMIN 4.6 04/10/2024   BILITOT 1.0 04/10/2024   ALKPHOS 80 04/10/2024   AST 22 04/10/2024   ALT 32 04/10/2024   ANIONGAP 11 04/11/2024   Lab Results  Component Value Date   CRP 5.8 (H) 04/10/2024    Micro:  Serology:  Imaging:  Assessment & Plan:   Problem List Items Addressed This Visit   None Visit Diagnoses       Infection of prosthetic joint, subsequent encounter    -  Primary   Relevant Orders   C-reactive protein   COMPLETE METABOLIC PANEL WITHOUT GFR   CBC w/Diff         No orders of the defined types were placed in this encounter.    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 I likely would keep cefadroxil   Labs today  Follow up in 14 weeks  Follow-up: Return in about 14 weeks (around 09/22/2024).      [name], MD [facility] 06/16/2024, 8:45 AM

## 2024-06-16 NOTE — Patient Instructions (Signed)
 Please do labs today and see me again in 3-6 months  At some point 6-12 months will talk about if you want to peel off one of the medication. I would prefer to keep you on the cefadroxil , but if you can tolerate both we can push them out both beyond a year  thanks

## 2024-06-17 LAB — CBC WITH DIFFERENTIAL/PLATELET
Absolute Lymphocytes: 2288 {cells}/uL (ref 850–3900)
Absolute Monocytes: 402 {cells}/uL (ref 200–950)
Basophils Absolute: 50 {cells}/uL (ref 0–200)
Basophils Relative: 0.9 %
Eosinophils Absolute: 187 {cells}/uL (ref 15–500)
Eosinophils Relative: 3.4 %
HCT: 43.5 % (ref 39.4–51.1)
Hemoglobin: 14.2 g/dL (ref 13.2–17.1)
MCH: 30.1 pg (ref 27.0–33.0)
MCHC: 32.6 g/dL (ref 31.6–35.4)
MCV: 92.4 fL (ref 81.4–101.7)
MPV: 9.7 fL (ref 7.5–12.5)
Monocytes Relative: 7.3 %
Neutro Abs: 2574 {cells}/uL (ref 1500–7800)
Neutrophils Relative %: 46.8 %
Platelets: 344 Thousand/uL (ref 140–400)
RBC: 4.71 Million/uL (ref 4.20–5.80)
RDW: 12.4 % (ref 11.0–15.0)
Total Lymphocyte: 41.6 %
WBC: 5.5 Thousand/uL (ref 3.8–10.8)

## 2024-06-17 LAB — C-REACTIVE PROTEIN: CRP: <3 mg/L (ref <8.0)

## 2024-09-22 ENCOUNTER — Ambulatory Visit: Admitting: Internal Medicine
